# Patient Record
Sex: Female | Born: 1995
Health system: Southern US, Community
[De-identification: ages and names within clinical notes are randomized; demographics above are authoritative.]

## PROBLEM LIST (undated history)

## (undated) DIAGNOSIS — T7840XA Allergy, unspecified, initial encounter: Secondary | ICD-10-CM

## (undated) DIAGNOSIS — N83209 Unspecified ovarian cyst, unspecified side: Secondary | ICD-10-CM

## (undated) HISTORY — DX: Unspecified ovarian cyst, unspecified side: N83.209

## (undated) HISTORY — PX: OTHER SURGICAL HISTORY: SHX169

## (undated) HISTORY — DX: Allergy, unspecified, initial encounter: T78.40XA

---
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
## 2015-09-27 ENCOUNTER — Ambulatory Visit: Payer: Self-pay | Admitting: Sports Medicine

## 2015-09-29 ENCOUNTER — Ambulatory Visit (INDEPENDENT_AMBULATORY_CARE_PROVIDER_SITE_OTHER): Payer: Self-pay | Admitting: Family Medicine

## 2015-09-29 ENCOUNTER — Encounter: Payer: Self-pay | Admitting: Family Medicine

## 2015-09-29 VITALS — BP 133/77 | HR 70 | Ht 65.0 in | Wt 115.0 lb

## 2015-09-29 DIAGNOSIS — R040 Epistaxis: Secondary | ICD-10-CM

## 2015-09-29 LAB — HEMATOCRIT: HEMATOCRIT: 38.1 % (ref 36.0–46.0)

## 2015-09-29 LAB — HEMOGLOBIN: HEMOGLOBIN: 13.1 g/dL (ref 12.0–15.0)

## 2015-09-29 MED ORDER — ALBUTEROL SULFATE HFA 108 (90 BASE) MCG/ACT IN AERS: 1.0000 | INHALATION_SPRAY | Freq: Four times a day (QID) | RESPIRATORY_TRACT | Status: DC | PRN

## 2015-09-29 MED ORDER — FLUTICASONE PROPIONATE 50 MCG/ACT NA SUSP: 1.0000 | Freq: Every day | NASAL | Status: DC

## 2015-09-29 NOTE — Assessment & Plan Note (Signed)
Pt w/ continual epistaxis that has been ongoing for several yrs.  Improved with simple gel/nasal spray and nasal saline. Concern for non visualized polyp, sphenopalantine/other vessel irritation and chronic bleeding.   - Referral to ENT for scope/tx - Start on nasonex 2 sprays each nostril qd to decrease inflammation, continue with gel/nasal spray/cortisone topical solution,  - Will check H&H/Ferritin due to chronic nature of this and her competitive swimming.  If borderline low ferritin or H&H, consider supplement with Fe 325 mg BID to TID.

## 2015-09-29 NOTE — Progress Notes (Signed)
Sydney CroftCarolyn A Blankenship is a 19 y.o. who presents today for prolonged chronic epistaxis of the L nare only.    Recurrent Epistaxis - Pt states this has been ongoing now for several years, since she was a child, and was seen originally in 2014 for same issue.   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  She has noticed a few episodes where she has had 6-7 clots expunged from the left nare, bleeding through towels that she has used to cease the bleed, and has become lightheaded at times after her nose bleed.  No recall of inciting events that bring on these nose bleeds and cannot recall any intervention that alleviates her Sxs.  She improved originally on nasal saline along with nasal gel topical when previously seen in 2014 and never saw the ENT doctor because she improved. However about 3-4 months ago she started having increases in her epistaxis and thought this was worse than previously. She has continued with the gel and nasal saline with minimal relief at this time.  There is no recent or previous injury to either nare, previous history of seasonal allergies, worsening of nose bleeds or sneezing with change of seasons.    Pt denies any menorrhagia, prolonged bleeding after superficial wound, bleeding s/p dental procedures, or any family history of coagulation disorder.  As well, denies lightheaded/dizziness w/ activity, palpitations or shortness of breath, Sx including Pica/pagophagia.    Past Medical History  Diagnosis Date  . Ovarian cyst    History   Social History Main Topics  . Smoking status: Never Smoker   . Drug Use: No  . Sexual Activity: No   FMHx - negative for Von Wilbrand disease, other coagulation disease.  + for brother with EIA   Physical Exam Filed Vitals:   09/29/15 1612  BP: 133/77  Pulse: 70    Gen: NAD, Well nourished, Well developed Head/Eyes: PERRLA, EOMI,  [place]/AT Nose: + edematous turbinates B/L, no evidence of active vessel lesion, no polyp visualized Throat: no postnasal drip or posterior pharynx irritation Non tender maxillary/front sinus  Ears: TMI B/L, no evidence of effusion  Neck: Supple, no LAD Cardio: RRR, No murmurs Lungs: CTA, no wheezes, rhonchi, crackles MSK: ROM normal Neuro: CN 2-12 intact, +2 patellar and achilles relfex b/l  Psych: AAO x 3

## 2015-09-30 ENCOUNTER — Telehealth: Payer: Self-pay | Admitting: Family Medicine

## 2015-09-30 LAB — FERRITIN: FERRITIN: 12 ng/mL (ref 10–291)

## 2015-09-30 MED ORDER — FERROUS SULFATE 325 (65 FE) MG PO TBEC
325.0000 mg | DELAYED_RELEASE_TABLET | Freq: Three times a day (TID) | ORAL | Status: DC
Start: 2015-09-30 — End: 2019-07-28

## 2015-09-30 NOTE — Telephone Encounter (Signed)
Ferritin extremely low.  Supplement with iron 325 BID-TID with Vit C concurrent tx.  F/U in 4-6 weeks for repeat Ferritin.  Will f/u after ENT eval too.  Thanks, Twana FirstBryan R. Paulina FusiHess, [facility] 09/30/2015, 9:00 AM

## 2016-08-02 ENCOUNTER — Ambulatory Visit: Payer: No Typology Code available for payment source | Admitting: Sports Medicine

## 2018-01-14 ENCOUNTER — Ambulatory Visit: Payer: No Typology Code available for payment source | Admitting: Sports Medicine

## 2018-05-24 LAB — HM PAP SMEAR

## 2019-07-11 DIAGNOSIS — H52221 Regular astigmatism, right eye: Secondary | ICD-10-CM | POA: Diagnosis not present

## 2019-07-11 DIAGNOSIS — H5213 Myopia, bilateral: Secondary | ICD-10-CM | POA: Diagnosis not present

## 2019-07-28 ENCOUNTER — Ambulatory Visit (INDEPENDENT_AMBULATORY_CARE_PROVIDER_SITE_OTHER): Payer: Commercial Managed Care - PPO | Admitting: Family Medicine

## 2019-07-28 ENCOUNTER — Encounter: Payer: Self-pay | Admitting: Family Medicine

## 2019-07-28 DIAGNOSIS — E041 Nontoxic single thyroid nodule: Secondary | ICD-10-CM | POA: Diagnosis not present

## 2019-07-28 NOTE — Progress Notes (Signed)
   Office Visit Note   Patient: Sydney Blankenship           Date of Birth: [birth year]           MRN: [id number] Visit Date: 07/28/2019 Requested by: No referring provider defined for this encounter. PCP: Patient, No Pcp Per  Subjective: Chief Complaint  Patient presents with  . anterior neck nodule - Mom noticed 1 week ago    HPI: She is here with a nodule on her neck.  About a week ago her mother noticed a nodule while she was swallowing food.  Her mother has a history of thyroid cancer after getting a lot of neck x-rays when she was younger.  Sydney Blankenship is asymptomatic, good energy with no change in weight, no change in bowel or bladder function.  She otherwise feels well.               ROS: No fever or chills.  All other systems were reviewed and are negative.  Objective: Vital Signs: There were no vitals taken for this visit.  Physical Exam:  General:  Alert and oriented, in no acute distress. Pulm:  Breathing unlabored. Psy:  Normal mood, congruent affect. Skin: No rash on the skin. Neck: There is a palpable nodule to the right of midline in the thyroid.  It is nontender to palpation.   Imaging: Limited ultrasound of neck: The nodule appears to be solid and measures about 1.6 cm diameter.  I did not bill for the procedure or record the images.  Assessment & Plan: 1.  Right-sided thyroid nodule with family history of thyroid cancer in her mother. -We will schedule a full thyroid ultrasound with biopsy if indicated.  Labs drawn today as well.      Procedures: No procedures performed  No notes on file     PMFS History: Patient Active Problem List   Diagnosis Date Noted  . Recurrent epistaxis 08/07/2013   Past Medical History:  Diagnosis Date  . Ovarian cyst     History reviewed. No pertinent family history.  Past Surgical History:  Procedure Laterality Date  . OTHER SURGICAL HISTORY     No surgical history    Social History   Occupational History  .  Not on file  Tobacco Use  . Smoking status: Never Smoker  Substance and Sexual Activity  . Alcohol use: No    Alcohol/week: 0.0 standard drinks  . Drug use: No  . Sexual activity: Never

## 2019-07-29 ENCOUNTER — Telehealth: Payer: Self-pay | Admitting: Family Medicine

## 2019-07-29 LAB — CBC WITH DIFFERENTIAL/PLATELET
Absolute Monocytes: 583 cells/uL (ref 200–950)
Basophils Absolute: 40 cells/uL (ref 0–200)
Basophils Relative: 0.6 %
Eosinophils Absolute: 208 cells/uL (ref 15–500)
Eosinophils Relative: 3.1 %
HCT: 42.3 % (ref 35.0–45.0)
Hemoglobin: 14.3 g/dL (ref 11.7–15.5)
Lymphs Abs: 1353 cells/uL (ref 850–3900)
MCH: 30.6 pg (ref 27.0–33.0)
MCHC: 33.8 g/dL (ref 32.0–36.0)
MCV: 90.6 fL (ref 80.0–100.0)
MPV: 10 fL (ref 7.5–12.5)
Monocytes Relative: 8.7 %
Neutro Abs: 4516 cells/uL (ref 1500–7800)
Neutrophils Relative %: 67.4 %
Platelets: 197 10*3/uL (ref 140–400)
RBC: 4.67 10*6/uL (ref 3.80–5.10)
RDW: 12 % (ref 11.0–15.0)
Total Lymphocyte: 20.2 %
WBC: 6.7 10*3/uL (ref 3.8–10.8)

## 2019-07-29 LAB — THYROID PANEL WITH TSH
Free Thyroxine Index: 2 (ref 1.4–3.8)
T3 Uptake: 28 % (ref 22–35)
T4, Total: 7.2 ug/dL (ref 5.1–11.9)
TSH: 3.87 mIU/L

## 2019-07-29 LAB — SEDIMENTATION RATE: Sed Rate: 2 mm/h (ref 0–20)

## 2019-07-29 NOTE — Telephone Encounter (Signed)
Labs look good.  [place]

## 2019-08-04 ENCOUNTER — Telehealth: Payer: Self-pay | Admitting: Family Medicine

## 2019-08-04 ENCOUNTER — Ambulatory Visit
Admission: RE | Admit: 2019-08-04 | Discharge: 2019-08-04 | Disposition: A | Discharge status: Home / Self Care | Payer: 59 | Source: Ambulatory Visit | Attending: Family Medicine | Admitting: Family Medicine

## 2019-08-04 DIAGNOSIS — E041 Nontoxic single thyroid nodule: Secondary | ICD-10-CM

## 2019-08-04 NOTE — Telephone Encounter (Signed)
By radiology criteria, the nodule warrants 1 year follow-up ultrasound.

## 2019-08-05 NOTE — Addendum Note (Signed)
Addended by: Hortencia Pilar on: 08/05/2019 12:52 PM   Modules accepted: Orders

## 2019-08-05 NOTE — Telephone Encounter (Signed)
Per patient request, after discussing with her parents she would like referral to Dr. Harlow Asa to discuss possible biopsy of thyroid nodule.  Will request referral.

## 2019-11-15 DIAGNOSIS — Z20828 Contact with and (suspected) exposure to other viral communicable diseases: Secondary | ICD-10-CM | POA: Diagnosis not present

## 2019-12-24 DIAGNOSIS — L700 Acne vulgaris: Secondary | ICD-10-CM | POA: Diagnosis not present

## 2019-12-25 DIAGNOSIS — Z23 Encounter for immunization: Secondary | ICD-10-CM | POA: Diagnosis not present

## 2019-12-25 MED FILL — CLINDAMYCIN PH 1% GEL: 1 | 30 days supply | Qty: 60 | Fill #0

## 2019-12-25 MED FILL — MINOCYCLINE 100 MG CAPSULE: 100 | 30 days supply | Qty: 60 | Fill #0

## 2019-12-25 MED FILL — EPIDUO FORTE 0.3-2.5% GEL P: 0.3-2.5 | 30 days supply | Qty: 45 | Fill #0

## 2020-01-22 DIAGNOSIS — Z23 Encounter for immunization: Secondary | ICD-10-CM | POA: Diagnosis not present

## 2020-02-05 DIAGNOSIS — Z01419 Encounter for gynecological examination (general) (routine) without abnormal findings: Secondary | ICD-10-CM | POA: Diagnosis not present

## 2020-02-05 DIAGNOSIS — Z6821 Body mass index (BMI) 21.0-21.9, adult: Secondary | ICD-10-CM | POA: Diagnosis not present

## 2020-02-05 DIAGNOSIS — Z118 Encounter for screening for other infectious and parasitic diseases: Secondary | ICD-10-CM | POA: Diagnosis not present

## 2020-02-13 MED FILL — MINOCYCLINE 100 MG CAPSULE: 100 | 30 days supply | Qty: 60 | Fill #1

## 2020-03-03 IMAGING — US US THYROID
1 series · 13 of 25 positions shown · non-contrast
Comparison: None.

CLINICAL DATA: Right thyroid nodule on exam

EXAM:
THYROID ULTRASOUND
TECHNIQUE: Ultrasound examination of the thyroid gland and adjacent soft
tissues was performed.

[Series 1: us thyroid · 0.05mm/px · 13 of 50 slices shown]
[im 1/50]
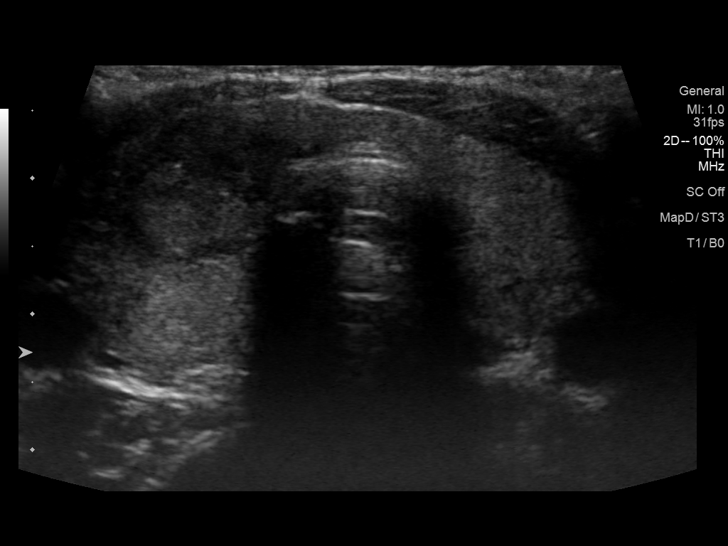
[im 5/50]
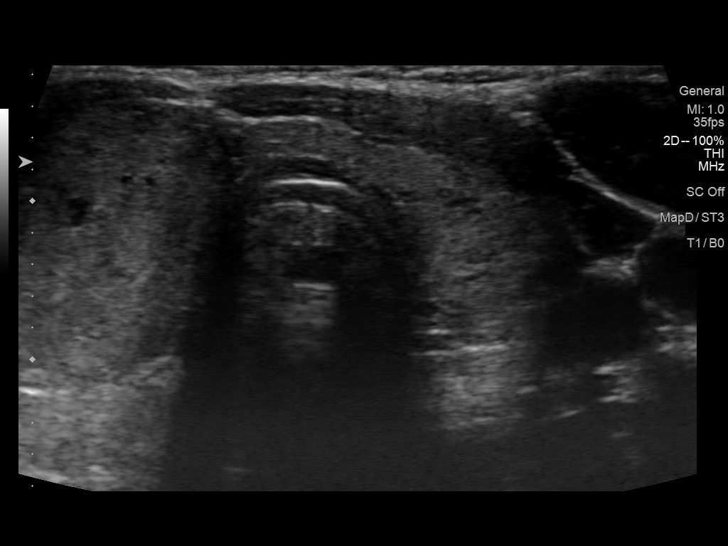
[im 9/50]
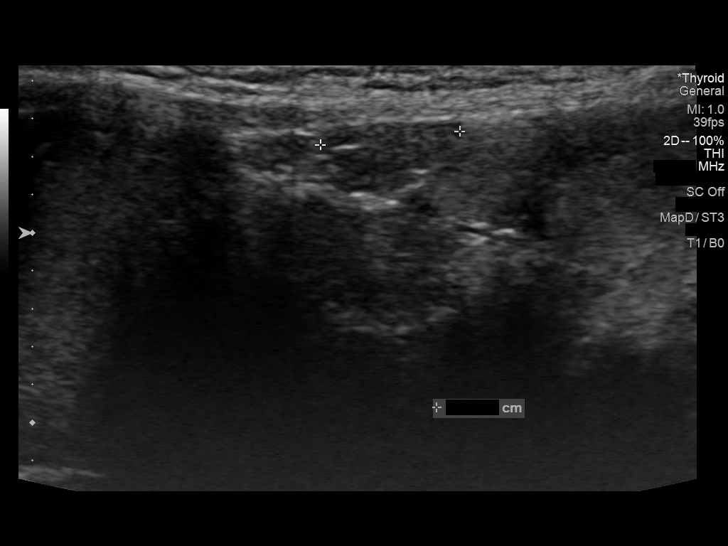
[im 13/50]
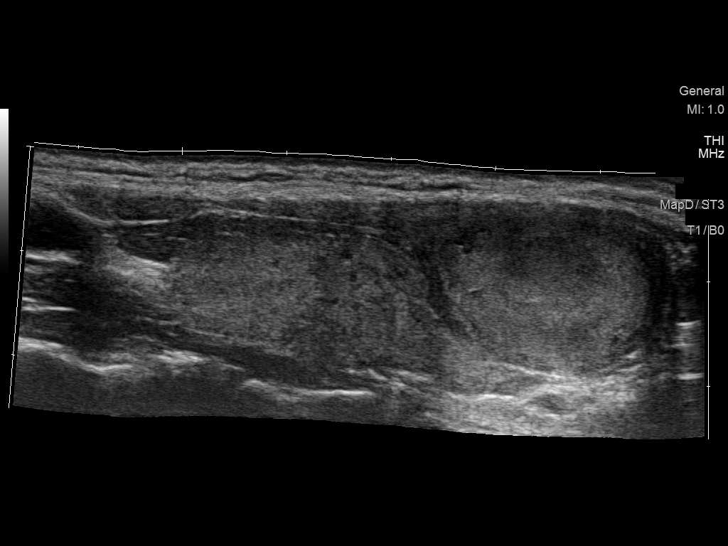
[im 17/50]
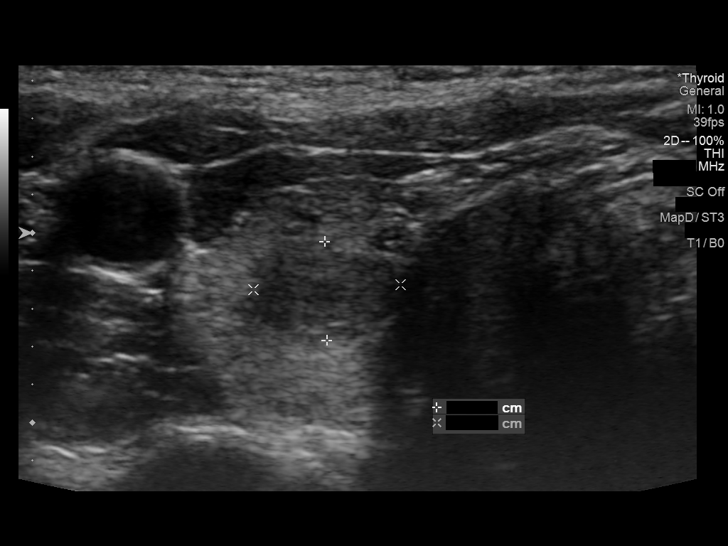
[im 21/50]
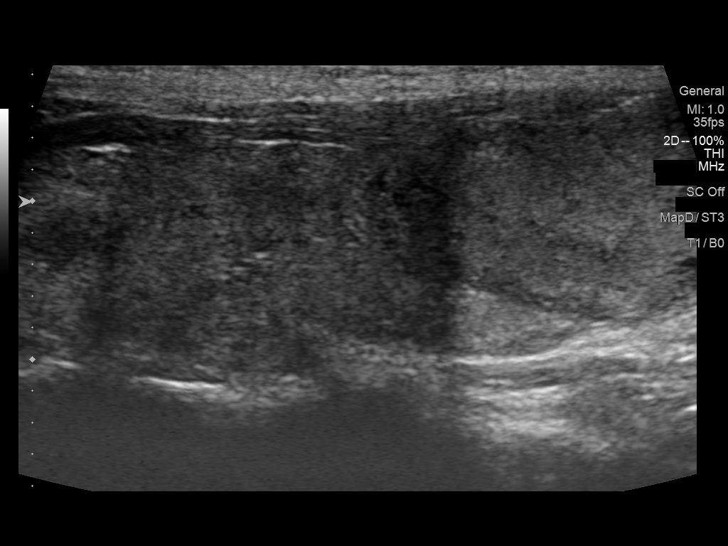
[im 25/50]
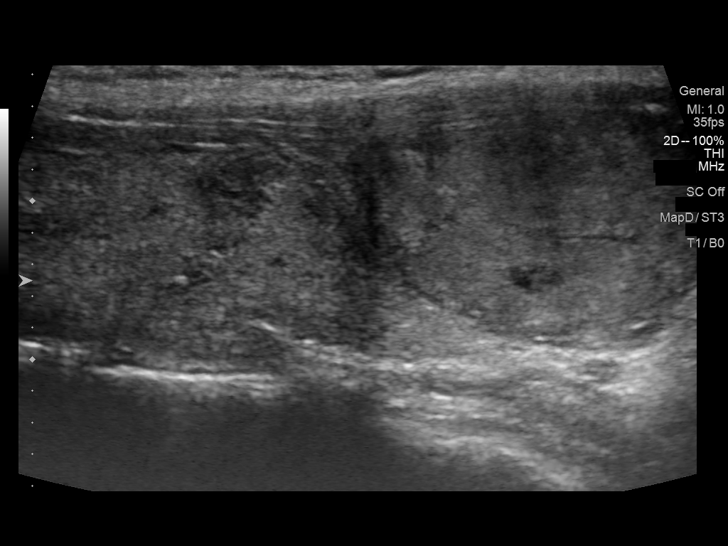
[im 29/50]
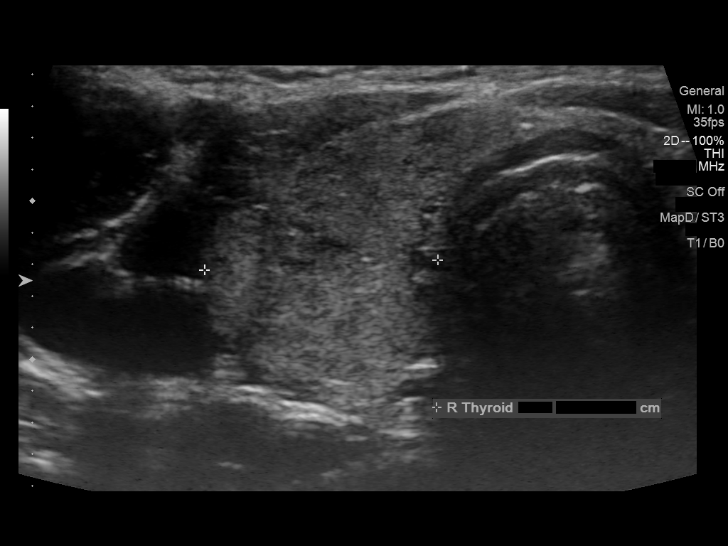
[im 33/50]
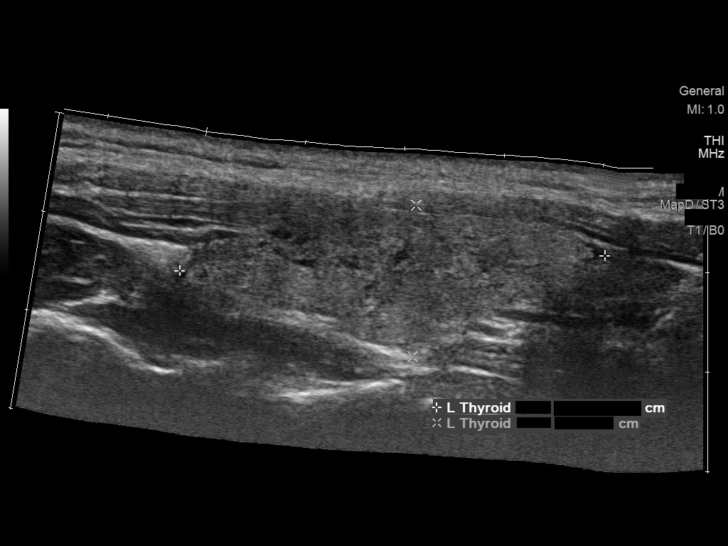
[im 37/50]
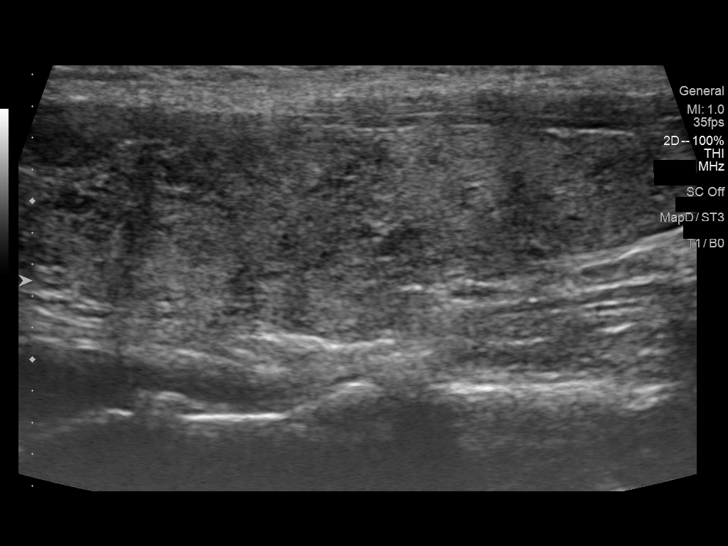
[im 41/50]
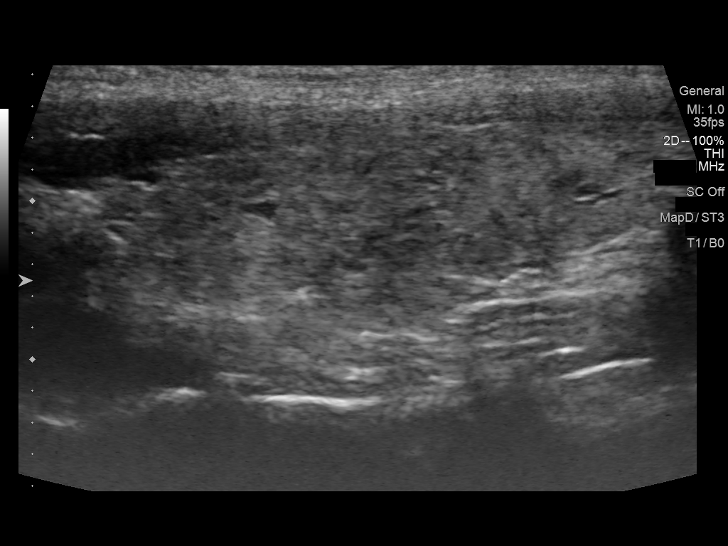
[im 45/50]
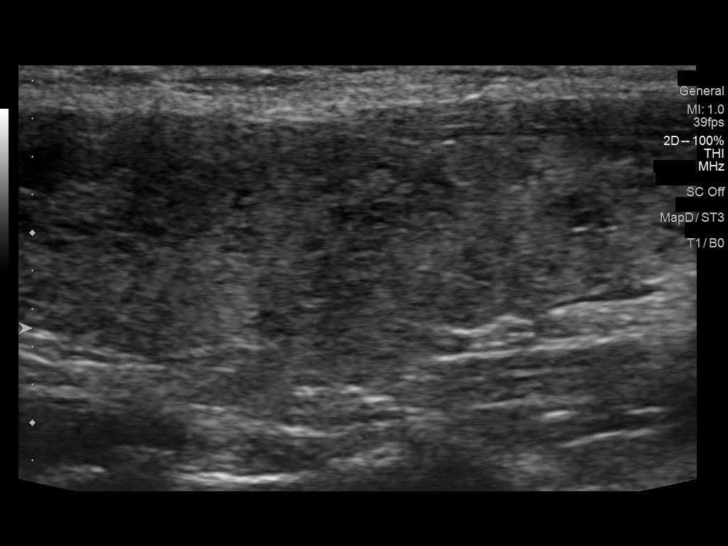
[im 50/50]
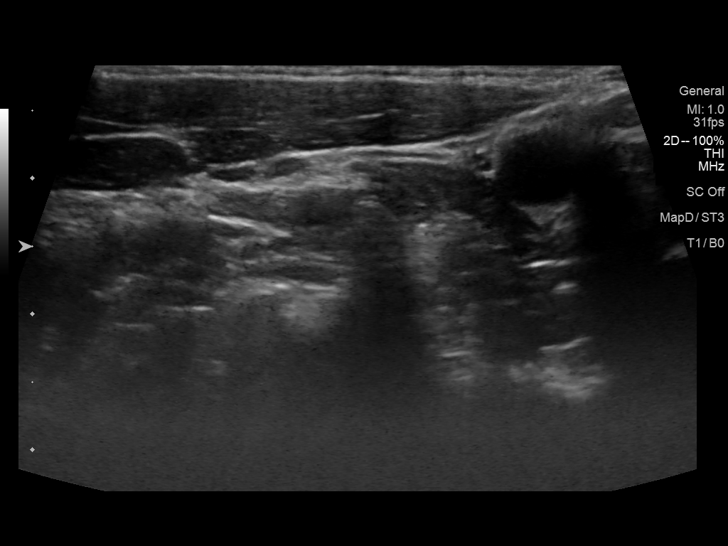

[13 of 25 positions shown; findings below may reference images not displayed]

FINDINGS: Parenchymal Echotexture: Mildly heterogenous

Isthmus: 3 mm

Right lobe: 5.1 x 1.7 x 1.5 cm

Left lobe: 4.3 x 1.5 x 1.2 cm

_________________________________________________________

Estimated total number of nodules >/= 1 cm: 1

Number of spongiform nodules >/=  2 cm not described below (TR1): 0

Number of mixed cystic and solid nodules >/= 1.5 cm not described
below (TR2): 0

_________________________________________________________

Nodule # 1:

Location: Right; Inferior

Maximum size: 2.3 cm; Other 2 dimensions: 1.9 x 1.6 cm

Composition: solid/almost completely solid (2)

Echogenicity: isoechoic (1)

Shape: not taller-than-wide (0)

Margins: smooth (0)

Echogenic foci: none (0)

ACR TI-RADS total points: 3.

ACR TI-RADS risk category: TR3 (3 points).

ACR TI-RADS recommendations:

*Given size (>/= 1.5 - 2.4 cm) and appearance, a follow-up
ultrasound in 1 year should be considered based on TI-RADS criteria.

_________________________________________________________

There are additional isthmus and right thyroid hypoechoic
subcentimeter nodules all measuring 8 mm or less in size. These
would not meet criteria for any biopsy or follow-up.

No significant left thyroid abnormality.

Normal vascularity.  No regional adenopathy.
IMPRESSION: 2.3 cm right inferior TR 3 nodule meets criteria for follow-up in 1
year as above.

The above is in keeping with the ACR TI-RADS recommendations - [HOSPITAL] 0754;[DATE].

## 2020-03-23 DIAGNOSIS — Z3202 Encounter for pregnancy test, result negative: Secondary | ICD-10-CM | POA: Diagnosis not present

## 2020-03-23 DIAGNOSIS — R030 Elevated blood-pressure reading, without diagnosis of hypertension: Secondary | ICD-10-CM | POA: Diagnosis not present

## 2020-03-23 DIAGNOSIS — Z309 Encounter for contraceptive management, unspecified: Secondary | ICD-10-CM | POA: Diagnosis not present

## 2020-03-23 DIAGNOSIS — N946 Dysmenorrhea, unspecified: Secondary | ICD-10-CM | POA: Diagnosis not present

## 2020-03-29 DIAGNOSIS — D509 Iron deficiency anemia, unspecified: Secondary | ICD-10-CM | POA: Diagnosis not present

## 2020-03-29 DIAGNOSIS — N939 Abnormal uterine and vaginal bleeding, unspecified: Secondary | ICD-10-CM | POA: Diagnosis not present

## 2020-03-29 DIAGNOSIS — Z3202 Encounter for pregnancy test, result negative: Secondary | ICD-10-CM | POA: Diagnosis not present

## 2020-03-29 DIAGNOSIS — R102 Pelvic and perineal pain: Secondary | ICD-10-CM | POA: Diagnosis not present

## 2020-04-28 MED FILL — MINOCYCLINE 100 MG CAPSULE: 100 | 30 days supply | Qty: 60 | Fill #2

## 2020-07-12 DIAGNOSIS — H52221 Regular astigmatism, right eye: Secondary | ICD-10-CM | POA: Diagnosis not present

## 2020-07-12 DIAGNOSIS — H53143 Visual discomfort, bilateral: Secondary | ICD-10-CM | POA: Diagnosis not present

## 2020-07-12 DIAGNOSIS — H53123 Transient visual loss, bilateral: Secondary | ICD-10-CM | POA: Diagnosis not present

## 2020-07-12 DIAGNOSIS — H5213 Myopia, bilateral: Secondary | ICD-10-CM | POA: Diagnosis not present

## 2020-08-17 MED FILL — MINOCYCLINE 100 MG CAPSULE: 100 | 30 days supply | Qty: 60 | Fill #3

## 2020-09-22 ENCOUNTER — Ambulatory Visit: Payer: 59 | Admitting: Physician Assistant

## 2020-10-18 ENCOUNTER — Ambulatory Visit (INDEPENDENT_AMBULATORY_CARE_PROVIDER_SITE_OTHER): Payer: No Typology Code available for payment source | Admitting: Physician Assistant

## 2020-10-18 ENCOUNTER — Other Ambulatory Visit: Payer: Self-pay

## 2020-10-18 ENCOUNTER — Other Ambulatory Visit (HOSPITAL_COMMUNITY): Payer: Self-pay | Admitting: Physician Assistant

## 2020-10-18 ENCOUNTER — Encounter: Payer: Self-pay | Admitting: Physician Assistant

## 2020-10-18 ENCOUNTER — Other Ambulatory Visit: Payer: Self-pay | Admitting: Physician Assistant

## 2020-10-18 VITALS — BP 140/100 | HR 62 | Temp 98.4°F | Ht 65.0 in | Wt 133.0 lb

## 2020-10-18 DIAGNOSIS — R5383 Other fatigue: Secondary | ICD-10-CM | POA: Diagnosis not present

## 2020-10-18 DIAGNOSIS — Z1159 Encounter for screening for other viral diseases: Secondary | ICD-10-CM | POA: Diagnosis not present

## 2020-10-18 DIAGNOSIS — Z114 Encounter for screening for human immunodeficiency virus [HIV]: Secondary | ICD-10-CM | POA: Diagnosis not present

## 2020-10-18 DIAGNOSIS — D509 Iron deficiency anemia, unspecified: Secondary | ICD-10-CM | POA: Diagnosis not present

## 2020-10-18 DIAGNOSIS — E041 Nontoxic single thyroid nodule: Secondary | ICD-10-CM | POA: Diagnosis not present

## 2020-10-18 DIAGNOSIS — Z136 Encounter for screening for cardiovascular disorders: Secondary | ICD-10-CM | POA: Diagnosis not present

## 2020-10-18 DIAGNOSIS — Z0000 Encounter for general adult medical examination without abnormal findings: Secondary | ICD-10-CM

## 2020-10-18 DIAGNOSIS — Z13220 Encounter for screening for lipoid disorders: Secondary | ICD-10-CM

## 2020-10-18 MED ORDER — SUMATRIPTAN SUCCINATE 100 MG PO TABS: 100.0000 mg | ORAL_TABLET | ORAL | 0 refills | Status: DC | PRN

## 2020-10-18 MED FILL — SUMAtriptan SUCCINATE 100 M: 100 | 30 days supply | Qty: 9 | Fill #0

## 2020-10-18 NOTE — Patient Instructions (Signed)
It was great to see you!  Please go to the lab for blood work.   Someone will be in touch with your scheduling your thyroid ultrasound.  Our office will call you with your results unless you have chosen to receive results via MyChart.  If your blood work is normal we will follow-up each year for physicals and as scheduled for chronic medical problems.  If anything is abnormal we will treat accordingly and get you in for a follow-up.  Take care,  [facility] Maintenance, Female Adopting a healthy lifestyle and getting preventive care are important in promoting health and wellness. Ask your health care provider about:  The right schedule for you to have regular tests and exams.  Things you can do on your own to prevent diseases and keep yourself healthy. What should I know about diet, weight, and exercise? Eat a healthy diet   Eat a diet that includes plenty of vegetables, fruits, low-fat dairy products, and lean protein.  Do not eat a lot of foods that are high in solid fats, added sugars, or sodium. Maintain a healthy weight Body mass index (BMI) is used to identify weight problems. It estimates body fat based on height and weight. Your health care provider can help determine your BMI and help you achieve or maintain a healthy weight. Get regular exercise Get regular exercise. This is one of the most important things you can do for your health. Most adults should:  Exercise for at least 150 minutes each week. The exercise should increase your heart rate and make you sweat (moderate-intensity exercise).  Do strengthening exercises at least twice a week. This is in addition to the moderate-intensity exercise.  Spend less time sitting. Even light physical activity can be beneficial. Watch cholesterol and blood lipids Have your blood tested for lipids and cholesterol at 24 years of age, then have this test every 5 years. Have your cholesterol levels checked more often  if:  Your lipid or cholesterol levels are high.  You are older than 24 years of age.  You are at high risk for heart disease. What should I know about cancer screening? Depending on your health history and family history, you may need to have cancer screening at various ages. This may include screening for:  Breast cancer.  Cervical cancer.  Colorectal cancer.  Skin cancer.  Lung cancer. What should I know about heart disease, diabetes, and high blood pressure? Blood pressure and heart disease  High blood pressure causes heart disease and increases the risk of stroke. This is more likely to develop in people who have high blood pressure readings, are of African descent, or are overweight.  Have your blood pressure checked: ? Every 3-5 years if you are 8-23 years of age. ? Every year if you are 33 years old or older. Diabetes Have regular diabetes screenings. This checks your fasting blood sugar level. Have the screening done:  Once every three years after age 53 if you are at a normal weight and have a low risk for diabetes.  More often and at a younger age if you are overweight or have a high risk for diabetes. What should I know about preventing infection? Hepatitis B If you have a higher risk for hepatitis B, you should be screened for this virus. Talk with your health care provider to find out if you are at risk for hepatitis B infection. Hepatitis C Testing is recommended for:  Everyone born from [birth year]  through 1965.  Anyone with known risk factors for hepatitis C. Sexually transmitted infections (STIs)  Get screened for STIs, including gonorrhea and chlamydia, if: ? You are sexually active and are younger than 24 years of age. ? You are older than 24 years of age and your health care provider tells you that you are at risk for this type of infection. ? Your sexual activity has changed since you were last screened, and you are at increased risk for chlamydia or  gonorrhea. Ask your health care provider if you are at risk.  Ask your health care provider about whether you are at high risk for HIV. Your health care provider may recommend a prescription medicine to help prevent HIV infection. If you choose to take medicine to prevent HIV, you should first get tested for HIV. You should then be tested every 3 months for as long as you are taking the medicine. Pregnancy  If you are about to stop having your period (premenopausal) and you may become pregnant, seek counseling before you get pregnant.  Take 400 to 800 micrograms (mcg) of folic acid every day if you become pregnant.  Ask for birth control (contraception) if you want to prevent pregnancy. Osteoporosis and menopause Osteoporosis is a disease in which the bones lose minerals and strength with aging. This can result in bone fractures. If you are 61 years old or older, or if you are at risk for osteoporosis and fractures, ask your health care provider if you should:  Be screened for bone loss.  Take a calcium or vitamin D supplement to lower your risk of fractures.  Be given hormone replacement therapy (HRT) to treat symptoms of menopause. Follow these instructions at home: Lifestyle  Do not use any products that contain nicotine or tobacco, such as cigarettes, e-cigarettes, and chewing tobacco. If you need help quitting, ask your health care provider.  Do not use street drugs.  Do not share needles.  Ask your health care provider for help if you need support or information about quitting drugs. Alcohol use  Do not drink alcohol if: ? Your health care provider tells you not to drink. ? You are pregnant, may be pregnant, or are planning to become pregnant.  If you drink alcohol: ? Limit how much you use to 0-1 drink a day. ? Limit intake if you are breastfeeding.  Be aware of how much alcohol is in your drink. In the [place] one drink equals one 12 oz bottle of beer (355 mL), one 5 oz  glass of wine (148 mL), or one 1 oz glass of hard liquor (44 mL). General instructions  Schedule regular health, dental, and eye exams.  Stay current with your vaccines.  Tell your health care provider if: ? You often feel depressed. ? You have ever been abused or do not feel safe at home. Summary  Adopting a healthy lifestyle and getting preventive care are important in promoting health and wellness.  Follow your health care provider's instructions about healthy diet, exercising, and getting tested or screened for diseases.  Follow your health care provider's instructions on monitoring your cholesterol and blood pressure. This information is not intended to replace advice given to you by your health care provider. Make sure you discuss any questions you have with your health care provider. Document Revised: 11/20/2018 Document Reviewed: 11/20/2018 Elsevier Patient Education  2020 [employer].

## 2020-10-18 NOTE — Progress Notes (Signed)
Sydney Blankenship is a 24 y.o. female here to Establish care.  I acted as a Neurosurgeon for [employer], PA-C Corky Mull, LPN   History of Present Illness:   Chief Complaint  Patient presents with  . Establish Care  . Annual Exam    Acute Concerns: Fatigue, weakness -- was told in the past that she had low iron in 2016, was told to take oral iron. Goes to bed around 1030p - 700a. Denies snoring. Weakness feels like "her head is heavy." Gets lightheaded at times when going from sitting to standing. Denies: unusual chest pain, SOB when exercising. Denies extremity weakness or lethargy. She is under significant stress -- planning to start a new job tomorrow and is living on her own for the first time.  Chronic Issues: Iron deficiency anemia -- was found in the past during labwork for fatigue. Periods lasts 5-7 days. Heavy for 2-3 days where she has to change pad/tampon -- q 3 hours. Took OCPs for about 1 month but she felt like the moodiness was not worth taking the rx. Thyroid nodule -- found last year in 2020. She had normal labs but did have thyroid u/s confirming nodule and recommending repeat imaging in 1 year. Has a 2.3 cm right inferior TR 3 nodule. Migraines -- gets migraines with certain alcohol, lack of sleep or stressful situation. Her mom has an abortive medication that the patient takes which stops her migraines almost immediately. She is unsure of the name of this. Migraines are overall rare.  Health Maintenance: Immunizations -- UTD  Colonoscopy -- N/A Mammogram -- N/A PAP -- UTD, done 2020 will get records Bone Density -- N/A Diet -- eats regular meals, very healthy, drinks water; no regular sodas or juices Caffeine intake -- 2 coffees daily Sleep habits -- no major concerns Exercise -- weight lifting and cardio mix -- 5 days a week Weight -- Weight: 133 lb (60.3 kg)  Mood -- no concerns right now Alcohol -- 2 drinks per week Tobacco -- none  Depression screen  [facility] 2/9 10/18/2020  Decreased Interest 0  Down, Depressed, Hopeless 0  PHQ - 2 Score 0    No flowsheet data found.   Other providers/specialists: Patient Care Team: Jarold Motto, [place] as PCP - General (Physician Assistant) Darnell Level, MD as Consulting Physician (General Surgery)   Past Medical History:  Diagnosis Date  . Allergy   . Ovarian cyst    8th grade     Social History   Tobacco Use  . Smoking status: Never Smoker  . Smokeless tobacco: Never Used  Vaping Use  . Vaping Use: Never used  Substance Use Topics  . Alcohol use: Yes    Alcohol/week: 2.0 standard drinks    Types: 1 Glasses of wine, 1 Cans of beer per week  . Drug use: Never    Past Surgical History:  Procedure Laterality Date  . OTHER SURGICAL HISTORY     No surgical history     Family History  Problem Relation Age of Onset  . Osteoarthritis Mother   . Thyroid cancer Mother   . Crohn's disease Sister   . Lung cancer Maternal Grandmother   . Kidney disease Maternal Grandfather   . Heart attack Paternal Grandfather   . Heart disease Paternal Grandfather     No Known Allergies   Current Medications:   Current Outpatient Medications:  .  ibuprofen (ADVIL,MOTRIN) 200 MG tablet, Take 400 mg by mouth every 6 (six) hours as  needed. , Disp: , Rfl:    Review of Systems:   Review of Systems  Constitutional: Negative.  Negative for chills, fever, malaise/fatigue and weight loss.  HENT: Negative.  Negative for hearing loss, sinus pain and sore throat.   Eyes: Negative.  Negative for blurred vision.  Respiratory: Negative.  Negative for cough and shortness of breath.   Cardiovascular: Negative.  Negative for chest pain, palpitations and leg swelling.  Gastrointestinal: Negative for constipation, diarrhea, heartburn, nausea and vomiting.  Genitourinary: Negative.  Negative for dysuria, frequency and urgency.  Musculoskeletal: Negative.  Negative for back pain, myalgias and neck pain.  Skin:  Negative.  Negative for itching and rash.  Neurological: Negative for dizziness, tingling, seizures, loss of consciousness and headaches.  Endo/Heme/Allergies: Negative.  Negative for polydipsia.  Psychiatric/Behavioral: Negative.  Negative for depression. The patient is not nervous/anxious.     Vitals:   Vitals:   10/18/20 1310  BP: (!) 140/100  Pulse: 62  Temp: 98.4 F (36.9 C)  TempSrc: Temporal  SpO2: 99%  Weight: 133 lb (60.3 kg)  Height: 5\' 5"  (1.651 m)      Body mass index is 22.13 kg/m.  Physical Exam:   Physical Exam Vitals and nursing note reviewed.  Constitutional:      General: She is not in acute distress.    Appearance: Normal appearance. She is well-developed. She is not ill-appearing or toxic-appearing.  HENT:     Head: Normocephalic and atraumatic.     Right Ear: Tympanic membrane, ear canal and external ear normal. Tympanic membrane is not erythematous, retracted or bulging.     Left Ear: Tympanic membrane, ear canal and external ear normal. Tympanic membrane is not erythematous, retracted or bulging.  Eyes:     General: Lids are normal.     Conjunctiva/sclera: Conjunctivae normal.     Pupils: Pupils are equal, round, and reactive to light.  Neck:     Trachea: Trachea normal.  Cardiovascular:     Rate and Rhythm: Normal rate and regular rhythm.     Heart sounds: Normal heart sounds, S1 normal and S2 normal.  Pulmonary:     Effort: Pulmonary effort is normal. No tachypnea or respiratory distress.     Breath sounds: Normal breath sounds. No decreased breath sounds, wheezing, rhonchi or rales.  Abdominal:     General: Bowel sounds are normal.     Palpations: Abdomen is soft.     Tenderness: There is no abdominal tenderness.  Musculoskeletal:        General: Normal range of motion.     Cervical back: Full passive range of motion without pain.  Lymphadenopathy:     Cervical: No cervical adenopathy.  Skin:    General: Skin is warm and dry.   Neurological:     Mental Status: She is alert.     GCS: GCS eye subscore is 4. GCS verbal subscore is 5. GCS motor subscore is 6.     Cranial Nerves: No cranial nerve deficit.     Sensory: No sensory deficit.     Deep Tendon Reflexes: Reflexes are normal and symmetric.  Psychiatric:        Speech: Speech normal.        Behavior: Behavior normal. Behavior is cooperative.     Assessment and Plan:   Kyler was seen today for establish care and annual exam.  Diagnoses and all orders for this visit:  Routine physical examination Today patient counseled on age appropriate routine health concerns  for screening and prevention, each reviewed and up to date or declined. Immunizations reviewed and up to date or declined. Labs ordered and reviewed. Risk factors for depression reviewed and negative. Hearing function and visual acuity are intact. ADLs screened and addressed as needed. Functional ability and level of safety reviewed and appropriate. Education, counseling and referrals performed based on assessed risks today. Patient provided with a copy of personalized plan for preventive services.  Fatigue, unspecified type No red flags on exam. Suspect multifactorial -- anxiety, possible anemia, among other causes. Will obtain labs including Vitamin B12 and D for further evaluation. Recommend follow-up in our office after she gets situated at her job if her symptoms worsen/persist. -     CBC with Differential/Platelet; Future -     Comprehensive metabolic panel; Future -     Vitamin B12; Future -     VITAMIN D 25 Hydroxy (Vit-D Deficiency, Fractures); Future -     VITAMIN D 25 Hydroxy (Vit-D Deficiency, Fractures) -     Vitamin B12 -     Comprehensive metabolic panel -     CBC with Differential/Platelet  Iron deficiency anemia, unspecified iron deficiency anemia type No red flags on exam. Will update labs today and advise on supplementation as indicated. -     Ferritin; Future -      Ferritin  Right thyroid nodule Update thyroid labs today and obtain follow-up u/s. -     US THYROID; Future -     TSH; Future -     T4, free; Future  Screening for HIV (human immunodeficiency virus) -     HIV Antibody (routine testing w rflx); Future -     HIV Antibody (routine testing w rflx)  Encounter for screening for other viral diseases -     Hepatitis C antibody; Future -     Hepatitis C antibody  Encounter for lipid screening for cardiovascular disease -     Lipid panel; Future -     Lipid panel  CMA or LPN served as scribe during this visit. History, Physical, and Plan performed by medical provider. The above documentation has been reviewed and is accurate and complete.  Jarold Motto, PA-C

## 2020-10-19 ENCOUNTER — Other Ambulatory Visit: Payer: Self-pay | Admitting: Physician Assistant

## 2020-10-19 DIAGNOSIS — R17 Unspecified jaundice: Secondary | ICD-10-CM

## 2020-10-19 LAB — FERRITIN: Ferritin: 23 ng/mL (ref 16–154)

## 2020-10-19 LAB — CBC WITH DIFFERENTIAL/PLATELET
Absolute Monocytes: 760 cells/uL (ref 200–950)
Basophils Absolute: 80 cells/uL (ref 0–200)
Basophils Relative: 0.8 %
Eosinophils Absolute: 370 cells/uL (ref 15–500)
Eosinophils Relative: 3.7 %
HCT: 45.4 % — ABNORMAL HIGH (ref 35.0–45.0)
Hemoglobin: 15.5 g/dL (ref 11.7–15.5)
Lymphs Abs: 1400 cells/uL (ref 850–3900)
MCH: 30.1 pg (ref 27.0–33.0)
MCHC: 34.1 g/dL (ref 32.0–36.0)
MCV: 88.2 fL (ref 80.0–100.0)
MPV: 9.8 fL (ref 7.5–12.5)
Monocytes Relative: 7.6 %
Neutro Abs: 7390 cells/uL (ref 1500–7800)
Neutrophils Relative %: 73.9 %
Platelets: 225 10*3/uL (ref 140–400)
RBC: 5.15 10*6/uL — ABNORMAL HIGH (ref 3.80–5.10)
RDW: 11.7 % (ref 11.0–15.0)
Total Lymphocyte: 14 %
WBC: 10 10*3/uL (ref 3.8–10.8)

## 2020-10-19 LAB — LIPID PANEL
Cholesterol: 150 mg/dL (ref <200)
HDL: 46 mg/dL — ABNORMAL LOW (ref >=50)
LDL Cholesterol (Calc): 76 mg/dL (calc)
Non-HDL Cholesterol (Calc): 104 mg/dL (calc) (ref <130)
Total CHOL/HDL Ratio: 3.3 (calc) (ref <5.0)
Triglycerides: 180 mg/dL — ABNORMAL HIGH (ref <150)

## 2020-10-19 LAB — COMPREHENSIVE METABOLIC PANEL
AG Ratio: 1.7 (calc) (ref 1.0–2.5)
ALT: 15 U/L (ref 6–29)
AST: 18 U/L (ref 10–30)
Albumin: 4.7 g/dL (ref 3.6–5.1)
Alkaline phosphatase (APISO): 47 U/L (ref 31–125)
BUN: 14 mg/dL (ref 7–25)
CO2: 26 mmol/L (ref 20–32)
Calcium: 9.7 mg/dL (ref 8.6–10.2)
Chloride: 102 mmol/L (ref 98–110)
Creat: 0.91 mg/dL (ref 0.50–1.10)
Globulin: 2.8 g/dL (calc) (ref 1.9–3.7)
Glucose, Bld: 79 mg/dL (ref 65–99)
Potassium: 4.1 mmol/L (ref 3.5–5.3)
Sodium: 137 mmol/L (ref 135–146)
Total Bilirubin: 1.9 mg/dL — ABNORMAL HIGH (ref 0.2–1.2)
Total Protein: 7.5 g/dL (ref 6.1–8.1)

## 2020-10-19 LAB — HEPATITIS C ANTIBODY
Hepatitis C Ab: NONREACTIVE
SIGNAL TO CUT-OFF: 0.02 (ref <1.00)

## 2020-10-19 LAB — VITAMIN B12: Vitamin B-12: 605 pg/mL (ref 200–1100)

## 2020-10-19 LAB — T4, FREE: Free T4: 1 ng/dL (ref 0.8–1.8)

## 2020-10-19 LAB — HIV ANTIBODY (ROUTINE TESTING W REFLEX): HIV 1&2 Ab, 4th Generation: NONREACTIVE

## 2020-10-19 LAB — TSH: TSH: 3.68 mIU/L

## 2020-10-19 LAB — VITAMIN D 25 HYDROXY (VIT D DEFICIENCY, FRACTURES): Vit D, 25-Hydroxy: 40 ng/mL (ref 30–100)

## 2020-10-26 ENCOUNTER — Encounter: Payer: Self-pay | Admitting: Physician Assistant

## 2020-10-26 LAB — CHLAMYDIA TRACHOMATIS, PROBE AMP
Chlamydia trachomatis, NAA: NEGATIVE
Neisseria gonorrhoeae, NAA: NEGATIVE

## 2020-10-28 ENCOUNTER — Other Ambulatory Visit: Payer: No Typology Code available for payment source

## 2020-10-28 ENCOUNTER — Other Ambulatory Visit: Payer: Self-pay

## 2020-10-28 DIAGNOSIS — R17 Unspecified jaundice: Secondary | ICD-10-CM

## 2020-10-29 ENCOUNTER — Encounter: Payer: Self-pay | Admitting: Physician Assistant

## 2020-10-29 LAB — HEPATIC FUNCTION PANEL
AG Ratio: 1.7 (calc) (ref 1.0–2.5)
ALT: 14 U/L (ref 6–29)
AST: 18 U/L (ref 10–30)
Albumin: 4.5 g/dL (ref 3.6–5.1)
Alkaline phosphatase (APISO): 38 U/L (ref 31–125)
Bilirubin, Direct: 0.4 mg/dL — ABNORMAL HIGH (ref 0.0–0.2)
Globulin: 2.7 g/dL (calc) (ref 1.9–3.7)
Indirect Bilirubin: 1.8 mg/dL (calc) — ABNORMAL HIGH (ref 0.2–1.2)
Total Bilirubin: 2.2 mg/dL — ABNORMAL HIGH (ref 0.2–1.2)
Total Protein: 7.2 g/dL (ref 6.1–8.1)

## 2020-10-29 LAB — HAPTOGLOBIN: Haptoglobin: 52 mg/dL (ref 43–212)

## 2020-11-12 ENCOUNTER — Encounter: Payer: Self-pay | Admitting: Physician Assistant

## 2020-11-15 ENCOUNTER — Other Ambulatory Visit: Payer: Self-pay | Admitting: Physician Assistant

## 2020-11-15 DIAGNOSIS — F419 Anxiety disorder, unspecified: Secondary | ICD-10-CM

## 2020-11-24 ENCOUNTER — Other Ambulatory Visit (HOSPITAL_COMMUNITY): Payer: Self-pay | Admitting: Physician Assistant

## 2020-11-24 ENCOUNTER — Other Ambulatory Visit: Payer: Self-pay | Admitting: *Deleted

## 2020-11-24 MED ORDER — SUMATRIPTAN SUCCINATE 100 MG PO TABS: 100.0000 mg | ORAL_TABLET | ORAL | 2 refills | Status: DC | PRN

## 2020-11-24 MED FILL — SUMAtriptan SUCCINATE 100 M: 100 | 30 days supply | Qty: 9 | Fill #0

## 2020-12-07 MED FILL — MINOCYCLINE 100 MG CAPSULE: 100 | 30 days supply | Qty: 60 | Fill #4

## 2020-12-13 ENCOUNTER — Ambulatory Visit (INDEPENDENT_AMBULATORY_CARE_PROVIDER_SITE_OTHER): Payer: No Typology Code available for payment source | Admitting: Psychology

## 2020-12-13 DIAGNOSIS — F411 Generalized anxiety disorder: Secondary | ICD-10-CM | POA: Diagnosis not present

## 2020-12-28 ENCOUNTER — Ambulatory Visit (INDEPENDENT_AMBULATORY_CARE_PROVIDER_SITE_OTHER): Payer: No Typology Code available for payment source | Admitting: Psychology

## 2020-12-28 DIAGNOSIS — F411 Generalized anxiety disorder: Secondary | ICD-10-CM

## 2020-12-29 MED FILL — SUMAtriptan SUCCINATE 100 M: 100 | 30 days supply | Qty: 9 | Fill #1

## 2021-01-10 ENCOUNTER — Ambulatory Visit (INDEPENDENT_AMBULATORY_CARE_PROVIDER_SITE_OTHER): Payer: No Typology Code available for payment source | Admitting: Psychology

## 2021-01-10 DIAGNOSIS — F411 Generalized anxiety disorder: Secondary | ICD-10-CM

## 2021-01-26 ENCOUNTER — Ambulatory Visit (INDEPENDENT_AMBULATORY_CARE_PROVIDER_SITE_OTHER): Payer: No Typology Code available for payment source | Admitting: Psychology

## 2021-01-26 DIAGNOSIS — F411 Generalized anxiety disorder: Secondary | ICD-10-CM | POA: Diagnosis not present

## 2021-01-27 ENCOUNTER — Telehealth: Payer: Self-pay

## 2021-01-27 DIAGNOSIS — L709 Acne, unspecified: Secondary | ICD-10-CM

## 2021-01-27 NOTE — Telephone Encounter (Signed)
Pt is requesting a referral to a dermatologist. She is not sure how the process works

## 2021-01-27 NOTE — Telephone Encounter (Signed)
Spoke to pt asked her if she wanted the Derm referral for acne? Pt said yes. Told her okay will place the referral and someone will contact you to schedule an appt. Pt verbalized understanding.

## 2021-02-02 MED FILL — SUMATRIPTAN SUCC 100 MG TAB: 100 | 30 days supply | Qty: 9 | Fill #2

## 2021-02-10 ENCOUNTER — Ambulatory Visit (INDEPENDENT_AMBULATORY_CARE_PROVIDER_SITE_OTHER): Payer: No Typology Code available for payment source | Admitting: Psychology

## 2021-02-10 DIAGNOSIS — F411 Generalized anxiety disorder: Secondary | ICD-10-CM | POA: Diagnosis not present

## 2021-03-01 ENCOUNTER — Ambulatory Visit (INDEPENDENT_AMBULATORY_CARE_PROVIDER_SITE_OTHER): Payer: No Typology Code available for payment source | Admitting: Psychology

## 2021-03-01 DIAGNOSIS — F411 Generalized anxiety disorder: Secondary | ICD-10-CM | POA: Diagnosis not present

## 2021-03-14 ENCOUNTER — Ambulatory Visit: Payer: No Typology Code available for payment source | Admitting: Psychology

## 2021-04-25 ENCOUNTER — Other Ambulatory Visit (HOSPITAL_COMMUNITY): Payer: Self-pay

## 2021-04-25 ENCOUNTER — Other Ambulatory Visit: Payer: Self-pay | Admitting: Physician Assistant

## 2021-04-25 MED ORDER — SUMATRIPTAN SUCCINATE 100 MG PO TABS
100.0000 mg | ORAL_TABLET | ORAL | 1 refills | Status: DC | PRN
  Filled 2021-04-25 – 2021-04-29 (×2): qty 9, 30d supply, fill #0
  Filled 2021-06-15 (×2): qty 9, 30d supply, fill #1
  Filled 2021-08-01: qty 2, 1d supply, fill #2

## 2021-04-25 MED FILL — Sumatriptan Succinate Tab 100 MG: ORAL | Qty: 10 | Fill #0 | Status: CN

## 2021-04-29 ENCOUNTER — Other Ambulatory Visit (HOSPITAL_COMMUNITY): Payer: Self-pay

## 2021-05-02 ENCOUNTER — Ambulatory Visit (INDEPENDENT_AMBULATORY_CARE_PROVIDER_SITE_OTHER): Payer: No Typology Code available for payment source | Admitting: Family

## 2021-05-02 ENCOUNTER — Other Ambulatory Visit: Payer: Self-pay

## 2021-05-02 ENCOUNTER — Encounter: Payer: Self-pay | Admitting: Family

## 2021-05-02 DIAGNOSIS — R0789 Other chest pain: Secondary | ICD-10-CM

## 2021-05-02 DIAGNOSIS — F32A Depression, unspecified: Secondary | ICD-10-CM | POA: Diagnosis not present

## 2021-05-02 DIAGNOSIS — F419 Anxiety disorder, unspecified: Secondary | ICD-10-CM

## 2021-05-02 DIAGNOSIS — R030 Elevated blood-pressure reading, without diagnosis of hypertension: Secondary | ICD-10-CM | POA: Diagnosis not present

## 2021-05-02 NOTE — Progress Notes (Signed)
Acute Office Visit  Subjective:    Patient ID: Sydney Blankenship, female    DOB: [birth year], 25 y.o.   MRN: [id number]  Chief Complaint  Patient presents with  . c/o elevated blood pressure    Pt has been c/o elevated blood pressure all weekend long, systolic 794'I, diastolic 01-65. Bp was 150/100 last Thursday at work.     HPI Patient is in today with concerns of persistently elevated blood pressure. She reports it being worse since she has been on night shift the past 2 months. She also reports that at night she has a heaviness in her chest that she has never had before. She is concerned that her body is not doing well on night shift. Admits to anxiety that she has not treated with medication. She exercises routinely and tries to watch her diet. She is actively training for a 1/2 marathon. She has a family history of HTN in both parents and cardiovascular disease. Not interested in taking any medication for her blood pressure at this time.   Past Medical History:  Diagnosis Date  . Allergy   . Ovarian cyst    8th grade    Past Surgical History:  Procedure Laterality Date  . OTHER SURGICAL HISTORY     No surgical history     Family History  Problem Relation Age of Onset  . Osteoarthritis Mother   . Thyroid cancer Mother   . Crohn's disease Sister   . Lung cancer Maternal Grandmother   . Kidney disease Maternal Grandfather   . Heart attack Paternal Grandfather   . Heart disease Paternal Grandfather     Social History   Socioeconomic History  . Marital status: Single    Spouse name: Not on file  . Number of children: Not on file  . Years of education: Not on file  . Highest education level: Not on file  Occupational History  . Not on file  Tobacco Use  . Smoking status: Never Smoker  . Smokeless tobacco: Never Used  Vaping Use  . Vaping Use: Never used  Substance and Sexual Activity  . Alcohol use: Yes    Alcohol/week: 2.0 standard drinks    Types: 1  Glasses of wine, 1 Cans of beer per week  . Drug use: Never  . Sexual activity: Yes    Birth control/protection: Condom, Diaphragm  Other Topics Concern  . 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 ibuprofen (ADVIL,MOTRIN) 200 MG tablet Take 400 mg by mouth every 6 (six) hours as needed.     . minocycline (MINOCIN) 100 MG capsule Take 100 mg by mouth 2 (two) times daily.    . SUMAtriptan (IMITREX) 100 MG tablet TAKE 1 TABLET (100 MG TOTAL) BY MOUTH EVERY 2 (TWO) HOURS AS NEEDED FOR MIGRAINE. MAY REPEAT IN 2 HOURS IF HEADACHE PERSISTS OR RECURS. 10 tablet 1  . SUMAtriptan (IMITREX) 100 MG tablet Take 1 tablet (100 mg total) by mouth every 2 (two) hours as needed for migraine. May  repeat in 2 hours if headache persists or recurs. 10 tablet 2  . SUMAtriptan (IMITREX) 100 MG tablet TAKE 1 TABLET BY MOUTH EVERY 2 HOURS AS NEEDED FOR MIGRAINE. MAY REPEAT IN 2 HOURS IF HEADACHE PERSISTS OR RECURS. 10 tablet 0   No facility-administered medications prior to visit.    No Known Allergies  Review of Systems     Objective:    Physical Exam  BP 140/90 (BP Location: Left Arm, Patient Position: Sitting, Cuff Size: Normal)   Pulse 66   Temp 97.6 F (36.4 C) (Temporal)   Ht _0  (1.651 m)   Wt 134 lb 6.1 oz (61 kg)   LMP 04/14/2021   SpO2 99%   BMI 22.36 kg/m  Wt Readings from Last 3 Encounters:  05/02/21 134 lb 6.1 oz (61 kg)  10/18/20 133 lb (60.3 kg)  09/29/15 115 lb (52.2 kg) (26 %, Z= -0.63)*   * Growth percentiles are based on CDC (Girls, 2-20 Years) data.     Health Maintenance Due  Topic Date Due  . HPV VACCINES (1 - 2-dose series) Never done  . COVID-19 Vaccine (3 - Booster for Moderna series) 06/19/2020  . PAP-Cervical Cytology Screening  05/24/2021  . PAP SMEAR-Modifier  05/24/2021       Topic Date Due  . HPV VACCINES (1 - 2-dose series) Never done     Lab Results  Component Value Date   TSH 3.68 10/18/2020   Lab Results  Component Value Date   WBC 10.0 10/18/2020   HGB 15.5 10/18/2020   HCT 45.4 (H) 10/18/2020   MCV 88.2 10/18/2020   PLT 225 10/18/2020   Lab Results  Component Value Date   NA 137 10/18/2020   K 4.1 10/18/2020   CO2 26 10/18/2020   GLUCOSE 79 10/18/2020   BUN 14 10/18/2020   CREATININE 0.91 10/18/2020   BILITOT 2.2 (H) 10/28/2020   AST 18 10/28/2020   ALT 14 10/28/2020   PROT 7.2 10/28/2020   CALCIUM 9.7 10/18/2020   Lab Results  Component Value Date   CHOL 150 10/18/2020   Lab Results  Component Value Date   HDL 46 (L) 10/18/2020   Lab Results  Component Value Date   LDLCALC 76 10/18/2020   Lab Results  Component Value Date   TRIG 180 (H) 10/18/2020   Lab Results  Component Value Date   CHOLHDL 3.3 10/18/2020   No results found for: HGBA1C     Assessment & Plan:   Problem List Items Addressed This Visit   None   Visit Diagnoses    Elevated blood pressure reading       Relevant Orders   CBC w/Diff   Comp Met (CMET)   Chest heaviness       Relevant Orders   EKG 12-Lead (Completed)   Thyroid Panel With TSH   CBC w/Diff   Comp Met (CMET)   Anxiety and depression       Relevant Orders   Thyroid Panel With TSH   CBC w/Diff   Comp Met (CMET)     Symptoms appear to be related to true HTN due to family history and she also appears to be dealing with anxiety that is under-treated. Continue with exercise and healthy Call the office with any questions or concerns. Recheck as scheduled and sooner as needed.   No orders of the defined types were placed in this  encounter.    Kennyth Arnold, FNP

## 2021-05-02 NOTE — Patient Instructions (Signed)

## 2021-05-03 LAB — CBC WITH DIFFERENTIAL/PLATELET
Basophils Absolute: 0.1 10*3/uL (ref 0.0–0.1)
Basophils Relative: 1 % (ref 0.0–3.0)
Eosinophils Absolute: 0.4 10*3/uL (ref 0.0–0.7)
Eosinophils Relative: 6.8 % — ABNORMAL HIGH (ref 0.0–5.0)
HCT: 40.8 % (ref 36.0–46.0)
Hemoglobin: 14.2 g/dL (ref 12.0–15.0)
Lymphocytes Relative: 19.8 % (ref 12.0–46.0)
Lymphs Abs: 1.2 10*3/uL (ref 0.7–4.0)
MCHC: 34.7 g/dL (ref 30.0–36.0)
MCV: 87.6 fl (ref 78.0–100.0)
Monocytes Absolute: 0.6 10*3/uL (ref 0.1–1.0)
Monocytes Relative: 9.1 % (ref 3.0–12.0)
Neutro Abs: 3.8 10*3/uL (ref 1.4–7.7)
Neutrophils Relative %: 63.3 % (ref 43.0–77.0)
Platelets: 197 10*3/uL (ref 150.0–400.0)
RBC: 4.66 Mil/uL (ref 3.87–5.11)
RDW: 12.7 % (ref 11.5–15.5)
WBC: 6.1 10*3/uL (ref 4.0–10.5)

## 2021-05-03 LAB — COMPREHENSIVE METABOLIC PANEL
ALT: 14 U/L (ref 0–35)
AST: 19 U/L (ref 0–37)
Albumin: 4.5 g/dL (ref 3.5–5.2)
Alkaline Phosphatase: 47 U/L (ref 39–117)
BUN: 19 mg/dL (ref 6–23)
CO2: 26 mEq/L (ref 19–32)
Calcium: 9.3 mg/dL (ref 8.4–10.5)
Chloride: 103 mEq/L (ref 96–112)
Creatinine, Ser: 0.85 mg/dL (ref 0.40–1.20)
GFR: 95.76 mL/min (ref >60.00)
Glucose, Bld: 84 mg/dL (ref 70–99)
Potassium: 3.7 mEq/L (ref 3.5–5.1)
Sodium: 139 mEq/L (ref 135–145)
Total Bilirubin: 1.4 mg/dL — ABNORMAL HIGH (ref 0.2–1.2)
Total Protein: 7.2 g/dL (ref 6.0–8.3)

## 2021-05-03 LAB — THYROID PANEL WITH TSH
Free Thyroxine Index: 1.9 (ref 1.4–3.8)
T3 Uptake: 30 % (ref 22–35)
T4, Total: 6.3 ug/dL (ref 5.1–11.9)
TSH: 3.83 mIU/L

## 2021-06-03 ENCOUNTER — Encounter: Payer: Self-pay | Admitting: Physician Assistant

## 2021-06-03 ENCOUNTER — Other Ambulatory Visit (HOSPITAL_COMMUNITY): Payer: Self-pay

## 2021-06-03 ENCOUNTER — Ambulatory Visit (INDEPENDENT_AMBULATORY_CARE_PROVIDER_SITE_OTHER): Payer: No Typology Code available for payment source | Admitting: Physician Assistant

## 2021-06-03 ENCOUNTER — Other Ambulatory Visit: Payer: Self-pay

## 2021-06-03 VITALS — BP 130/88 | HR 61 | Temp 98.0°F | Ht 65.0 in | Wt 135.2 lb

## 2021-06-03 DIAGNOSIS — L700 Acne vulgaris: Secondary | ICD-10-CM

## 2021-06-03 DIAGNOSIS — F32A Depression, unspecified: Secondary | ICD-10-CM

## 2021-06-03 DIAGNOSIS — I10 Essential (primary) hypertension: Secondary | ICD-10-CM | POA: Diagnosis not present

## 2021-06-03 DIAGNOSIS — F419 Anxiety disorder, unspecified: Secondary | ICD-10-CM

## 2021-06-03 MED ORDER — SPIRONOLACTONE 25 MG PO TABS
25.0000 mg | ORAL_TABLET | Freq: Every day | ORAL | 0 refills | Status: DC
  Filled 2021-06-03: qty 30, 30d supply, fill #0

## 2021-06-03 NOTE — Patient Instructions (Signed)
Take the Spironolactone as directed. Call if any problems with this medication. We are hoping this will treat your HTN and acne.   Keep a log of BP's 2-3x / week and bring to next appointment.  Good job with healthy lifestyle!!   Look into meditation and breathing techniques for anxiety.

## 2021-06-03 NOTE — Progress Notes (Signed)
Acute Office Visit  Subjective:    Patient ID: Sydney Blankenship, female    DOB: December 25, 1995, 25 y.o.   MRN: [id number]  Chief Complaint  Patient presents with   Acne   Hypertension    HPI Patient is in today for elevated blood pressure readings.  Patient states that she was seen on 05/02/2021 with our office and had blood work and EKG done, which was all normal at that time.  She was not started on any medication.  She notes that she is still having an average of 140s/ 80s daily for her blood pressure.  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  160/90 has been her highest reading while she was sitting at work. Mother has a history of HTN, 150/90 is average for her. Father's side has cardiac history.  Actively training for 1/2 marathon currently and feels well during her exercises.  Not on birth control. Does not drink or smoke or use illicit drugs. She only takes NSAIDs during her cycle. No hx of anemia. Sleeps well, no snoring or OSA she is aware of. No decongestants. No high-sodium in diet.  She does admit to feeling more anxious lately.  She would also like to get treatment for her acne or have a referral to dermatology today.  Mostly affects her face.  She has done well with minocycline in the past previously.   Past Medical History:  Diagnosis Date   Allergy    Ovarian cyst    8th grade    Past Surgical History:  Procedure Laterality Date   OTHER SURGICAL HISTORY     No surgical history     Family History  Problem Relation Age of Onset   Osteoarthritis Mother    Thyroid cancer Mother    Crohn's disease Sister    Lung cancer Maternal Grandmother    Kidney disease Maternal Grandfather    Heart attack Paternal Grandfather    Heart disease Paternal Grandfather     Social History   Socioeconomic History   Marital status: Single    Spouse name: Not on file   Number of children: Not  on file   Years of education: Not on file   Highest education level: Not on file  Occupational History   Not on file  Tobacco Use   Smoking status: Never   Smokeless tobacco: Never  Vaping Use   Vaping Use: Never used  Substance and Sexual Activity   Alcohol use: Yes    Alcohol/week: 2.0 standard drinks    Types: 1 Glasses of wine, 1 Cans of beer per week   Drug use: Never   Sexual activity: Yes    Birth control/protection: Condom, Diaphragm  Other Topics Concern   Not on file  Social History Narrative   Psychologist, 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      minocycline (MINOCIN) 100 MG  capsule Take 100 mg by mouth 2 (two) times daily.     SUMAtriptan (IMITREX) 100 MG tablet TAKE 1 TABLET (100 MG TOTAL) BY MOUTH EVERY 2 (TWO) HOURS AS NEEDED FOR MIGRAINE. MAY REPEAT IN 2 HOURS IF HEADACHE PERSISTS OR RECURS. 10 tablet 1   No facility-administered medications prior to visit.    No Known Allergies  Review of Systems  Constitutional:  Negative for activity change, appetite change, fever and unexpected weight change.  HENT:  Negative for congestion.   Eyes:  Negative for visual disturbance.  Respiratory:  Negative for apnea, cough and shortness of breath.   Cardiovascular:  Negative for chest pain, palpitations and leg swelling.  Gastrointestinal:  Negative for abdominal pain, blood in stool, constipation and diarrhea.  Endocrine: Negative for polydipsia, polyphagia and polyuria.  Genitourinary:  Negative for  dysuria and pelvic pain.  Musculoskeletal:  Negative for arthralgias.  Skin:  Negative for rash.  Neurological:  Negative for dizziness, weakness and headaches.  Hematological:  Negative for adenopathy. Does not bruise/bleed easily.  Psychiatric/Behavioral:  Negative for sleep disturbance and suicidal ideas. The patient is nervous/anxious (occasionally).        Objective:    Physical Exam Vitals and nursing note reviewed.  Constitutional:      Appearance: Normal appearance. She is normal weight. She is not toxic-appearing.  HENT:     Head: Normocephalic and atraumatic.     Right Ear: External ear normal.     Left Ear: External ear normal.     Nose: Nose normal.     Mouth/Throat:     Mouth: Mucous membranes are moist.  Eyes:     Extraocular Movements: Extraocular movements intact.     Conjunctiva/sclera: Conjunctivae normal.     Pupils: Pupils are equal, round, and reactive to light.  Cardiovascular:     Rate and Rhythm: Normal rate and regular rhythm.     Pulses: Normal pulses.     Heart sounds: Normal heart sounds.  Pulmonary:     Effort: Pulmonary effort is normal.     Breath sounds: Normal breath sounds.  Abdominal:     General: Abdomen is flat. Bowel sounds are normal.     Palpations: Abdomen is soft.  Musculoskeletal:        General: Normal range of motion.     Cervical back: Normal range of motion and neck supple.  Skin:    General: Skin is warm and dry.     Findings: Acne (facial acne present mostly around chin) present.  Neurological:     General: No focal deficit present.     Mental Status: She is alert and oriented to person, place, and time.  Psychiatric:        Mood and Affect: Mood normal.        Behavior: Behavior normal.        Thought Content: Thought content normal.        Judgment: Judgment normal.    BP 130/88   Pulse 61   Temp 98 F (36.7 C)   Ht 5\' 5"  (1.651 m)   Wt 135 lb 3.2 oz (61.3 kg)   SpO2 100%   BMI 22.50 kg/m  Wt Readings from  Last 3 Encounters:  06/03/21 135 lb 3.2 oz (61.3 kg)  05/02/21 134 lb 6.1 oz (61 kg)  10/18/20 133 lb (60.3 kg)    Health Maintenance Due  Topic Date Due   HPV VACCINES (1 - 2-dose series) Never done   COVID-19  Vaccine (3 - Booster for Moderna series) 06/19/2020   PAP-Cervical Cytology Screening  05/24/2021   PAP SMEAR-Modifier  05/24/2021       Topic Date Due   HPV VACCINES (1 - 2-dose series) Never done     Lab Results  Component Value Date   TSH 3.83 05/02/2021   Lab Results  Component Value Date   WBC 6.1 05/02/2021   HGB 14.2 05/02/2021   HCT 40.8 05/02/2021   MCV 87.6 05/02/2021   PLT 197.0 05/02/2021   Lab Results  Component Value Date   NA 139 05/02/2021   K 3.7 05/02/2021   CO2 26 05/02/2021   GLUCOSE 84 05/02/2021   BUN 19 05/02/2021   CREATININE 0.85 05/02/2021   BILITOT 1.4 (H) 05/02/2021   ALKPHOS 47 05/02/2021   AST 19 05/02/2021   ALT 14 05/02/2021   PROT 7.2 05/02/2021   ALBUMIN 4.5 05/02/2021   CALCIUM 9.3 05/02/2021   GFR 95.76 05/02/2021   Lab Results  Component Value Date   CHOL 150 10/18/2020   Lab Results  Component Value Date   HDL 46 (L) 10/18/2020   Lab Results  Component Value Date   LDLCALC 76 10/18/2020   Lab Results  Component Value Date   TRIG 180 (H) 10/18/2020   Lab Results  Component Value Date   CHOLHDL 3.3 10/18/2020   No results found for: HGBA1C     Assessment & Plan:   Problem List Items Addressed This Visit   None Visit Diagnoses     Essential hypertension    -  Primary   Relevant Medications   spironolactone (ALDACTONE) 25 MG tablet   Acne vulgaris       Anxiety and depression            Meds ordered this encounter  Medications   spironolactone (ALDACTONE) 25 MG tablet    Sig: Take 1 tablet (25 mg total) by mouth daily.    Dispense:  30 tablet    Refill:  0   1. Essential hypertension 2. Acne vulgaris 3. Anxiety and depression I reviewed the patient's last visit in May with [place] and  personally reviewed her labs and EKG.  Everything was unremarkable.  I do think there is a familial component to her issues with hypertension.  Stress and anxiety may also be a factor for her.  I recommended starting on spironolactone 25 mg once daily to see if this will help her blood pressure readings, as well as the issues with her acne.  I do not think she needs to start an SSRI just yet, but we will continue to revisit this as well.  She has tried therapy before and may look into this again.  I encouraged her to keep up the good work with her healthy lifestyle.  This note was prepared with assistance of Conservation officer, historic buildings. Occasional wrong-word or sound-a-like substitutions may have occurred due to the inherent limitations of voice recognition software.  Total time spent today reviewing chart and discussing with patient as well as dictation was 38 minutes.  Evora Schechter M Irisa Grimsley, PA-C

## 2021-06-15 ENCOUNTER — Other Ambulatory Visit (HOSPITAL_COMMUNITY): Payer: Self-pay

## 2021-06-23 ENCOUNTER — Other Ambulatory Visit: Payer: Self-pay | Admitting: Physician Assistant

## 2021-06-23 ENCOUNTER — Other Ambulatory Visit (HOSPITAL_COMMUNITY): Payer: Self-pay

## 2021-07-01 ENCOUNTER — Ambulatory Visit: Payer: No Typology Code available for payment source | Admitting: Physician Assistant

## 2021-07-19 ENCOUNTER — Other Ambulatory Visit (HOSPITAL_COMMUNITY): Payer: Self-pay

## 2021-07-19 MED ORDER — CARESTART COVID-19 HOME TEST VI KIT
PACK | 0 refills | Status: DC
  Filled 2021-07-19: qty 4, 4d supply, fill #0

## 2021-08-01 ENCOUNTER — Other Ambulatory Visit (HOSPITAL_COMMUNITY): Payer: Self-pay

## 2021-08-02 ENCOUNTER — Other Ambulatory Visit: Payer: Self-pay | Admitting: Family Medicine

## 2021-08-02 ENCOUNTER — Other Ambulatory Visit (HOSPITAL_COMMUNITY): Payer: Self-pay

## 2021-08-02 MED ORDER — SUMATRIPTAN SUCCINATE 100 MG PO TABS
100.0000 mg | ORAL_TABLET | ORAL | 1 refills | Status: DC | PRN
  Filled 2021-08-02 – 2021-08-05 (×3): qty 9, 30d supply, fill #0
  Filled 2021-09-27: qty 9, 30d supply, fill #1
  Filled 2021-11-09: qty 9, 30d supply, fill #2

## 2021-08-04 ENCOUNTER — Other Ambulatory Visit (HOSPITAL_COMMUNITY): Payer: Self-pay

## 2021-08-05 ENCOUNTER — Other Ambulatory Visit (HOSPITAL_COMMUNITY): Payer: Self-pay

## 2021-08-19 ENCOUNTER — Telehealth: Payer: Self-pay

## 2021-08-19 NOTE — Telephone Encounter (Signed)
ERROR

## 2021-08-24 ENCOUNTER — Ambulatory Visit (INDEPENDENT_AMBULATORY_CARE_PROVIDER_SITE_OTHER): Payer: No Typology Code available for payment source | Admitting: Physician Assistant

## 2021-08-24 ENCOUNTER — Other Ambulatory Visit: Payer: Self-pay

## 2021-08-24 ENCOUNTER — Other Ambulatory Visit (HOSPITAL_COMMUNITY): Payer: Self-pay

## 2021-08-24 ENCOUNTER — Encounter: Payer: Self-pay | Admitting: Physician Assistant

## 2021-08-24 VITALS — BP 131/91 | HR 75 | Temp 98.6°F | Ht 65.0 in | Wt 138.2 lb

## 2021-08-24 DIAGNOSIS — L700 Acne vulgaris: Secondary | ICD-10-CM | POA: Diagnosis not present

## 2021-08-24 MED ORDER — MINOCYCLINE HCL 100 MG PO CAPS
100.0000 mg | ORAL_CAPSULE | Freq: Two times a day (BID) | ORAL | 0 refills | Status: DC
  Filled 2021-08-24: qty 180, 90d supply, fill #0

## 2021-08-24 MED ORDER — CLINDAMYCIN PHOSPHATE 1 % EX SWAB
1.0000 | Freq: Two times a day (BID) | CUTANEOUS | 0 refills | Status: DC
  Filled 2021-08-24: qty 60, 30d supply, fill #0

## 2021-08-24 MED ORDER — CLINDAMYCIN PHOS-BENZOYL PEROX 1-5 % EX GEL
Freq: Two times a day (BID) | CUTANEOUS | 0 refills | Status: DC
  Filled 2021-08-24: qty 25, 30d supply, fill #0

## 2021-08-24 NOTE — Patient Instructions (Signed)
It was great to see you!  Minocycline sent in  Benzaclin topical   Referral to dermatology  Check out r/[email or web address]  Take care,  Jarold Motto PA-C

## 2021-08-24 NOTE — Progress Notes (Signed)
Sydney Blankenship is a 25 y.o. female here for a follow up of a pre-existing problem.  History of Present Illness:   Chief Complaint  Patient presents with   Referral    Dermatology    Dermatology Referral Pt is here for a referral. She had been taking minocycline for 6 months for acne, and took 2x 100 mg a day. It seemed to do well, but she ran out. Currently, she is using a topical OTC products. May have used benzaclin in the past. Was put on spironolactone by my colleague but didn't find this to be very helpful.  Denies concerns for pregnancy.  HPI   Past Medical History:  Diagnosis Date   Allergy    Ovarian cyst    8th grade     Social History   Tobacco Use   Smoking status: Never   Smokeless tobacco: Never  Vaping Use   Vaping Use: Never used  Substance Use Topics   Alcohol use: Yes    Alcohol/week: 2.0 standard drinks    Types: 1 Glasses of wine, 1 Cans of beer per week   Drug use: Never    Past Surgical History:  Procedure Laterality Date   OTHER SURGICAL HISTORY     No surgical history     Family History  Problem Relation Age of Onset   Osteoarthritis Mother    Thyroid cancer Mother    Crohn's disease Sister    Lung cancer Maternal Grandmother    Kidney disease Maternal Grandfather    Heart attack Paternal Grandfather    Heart disease Paternal Grandfather     No Known Allergies  Current Medications:   Current Outpatient Medications:    clindamycin (CLEOCIN T) 1 % SWAB, Apply 1 each topically 2 (two) times daily., Disp: 60 each, Rfl: 0   ibuprofen (ADVIL,MOTRIN) 200 MG tablet, Take 400 mg by mouth every 6 (six) hours as needed. , Disp: , Rfl:    minocycline (MINOCIN) 100 MG capsule, Take 1 capsule (100 mg total) by mouth 2 (two) times daily., Disp: 180 capsule, Rfl: 0   SUMAtriptan (IMITREX) 100 MG tablet, TAKE 1 TABLET (100 MG TOTAL) BY MOUTH EVERY 2 (TWO) HOURS AS NEEDED FOR MIGRAINE. MAY REPEAT IN 2 HOURS IF HEADACHE PERSISTS OR RECURS.,  Disp: 10 tablet, Rfl: 1   Review of Systems:   ROS Negative unless otherwise specified per HPI.  Vitals:   Vitals:   08/24/21 1456  BP: (!) 131/91  Pulse: 75  Temp: 98.6 F (37 C)  TempSrc: Temporal  SpO2: 99%  Weight: 138 lb 3.2 oz (62.7 kg)  Height: 5\' 5"  (1.651 m)     Body mass index is 23 kg/m.  Physical Exam:   Physical Exam Constitutional:      Appearance: Normal appearance. She is well-developed.  HENT:     Head: Normocephalic and atraumatic.  Eyes:     General: Lids are normal.     Extraocular Movements: Extraocular movements intact.     Conjunctiva/sclera: Conjunctivae normal.  Pulmonary:     Effort: Pulmonary effort is normal.  Musculoskeletal:        General: Normal range of motion.     Cervical back: Normal range of motion and neck supple.  Skin:    General: Skin is warm and dry.     Comments: Multiple scattered pustules across face.  Neurological:     Mental Status: She is alert and oriented to person, place, and time.  Psychiatric:  Attention and Perception: Attention and perception normal.        Mood and Affect: Mood normal.        Behavior: Behavior normal.        Thought Content: Thought content normal.        Judgment: Judgment normal.    Assessment and Plan:   @DIAGLIST @     I,Jordan Kelly,acting as a scribe for , PA.,have documented all relevant documentation on the behalf of [employer], PA,as directed by  Jarold Motto, PA while in the presence of Jarold Motto, Jarold Motto.  I, [place], Jarold Motto, have reviewed all documentation for this visit. The documentation on 08/24/21 for the exam, diagnosis, procedures, and orders are all accurate and complete.   08/26/21, PA-C

## 2021-09-27 ENCOUNTER — Other Ambulatory Visit (HOSPITAL_COMMUNITY): Payer: Self-pay

## 2021-09-28 ENCOUNTER — Other Ambulatory Visit (HOSPITAL_COMMUNITY): Payer: Self-pay

## 2021-10-17 ENCOUNTER — Encounter: Payer: Self-pay | Admitting: Physician Assistant

## 2021-11-09 ENCOUNTER — Other Ambulatory Visit (HOSPITAL_COMMUNITY): Payer: Self-pay

## 2021-11-09 ENCOUNTER — Other Ambulatory Visit: Payer: Self-pay | Admitting: Physician Assistant

## 2021-11-09 MED ORDER — SUMATRIPTAN SUCCINATE 100 MG PO TABS
100.0000 mg | ORAL_TABLET | ORAL | 1 refills | Status: DC | PRN
  Filled 2021-11-09: qty 9, 30d supply, fill #0
  Filled 2021-12-19: qty 9, 30d supply, fill #1
  Filled 2022-01-18: qty 2, 10d supply, fill #2

## 2021-11-16 NOTE — Progress Notes (Signed)
Sydney Blankenship is a 25 y.o. female here for anxiety.  History of Present Illness:   Chief Complaint  Patient presents with   Follow-up    Anxiety, pt has a lot of tasks and such that she want to discuss with Sam.     HPI  Anxiety [name] presents with c/o increasing anxiety over the past couple of weeks. According to pt, she believes it's time for her to finally start medication after putting this off for years. [name] states she has been having more anxiety attacks, crying spells, decreased libido, and increased spacing out. 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   As far as medication goes, she is looking for something that'll help her become more level headed. In the past, pt has participated in talk therapy through [facility], but stopped a couple of months later. She is interested in restarting talk therapy with someone new at this time. Denies SI/HI.  Past Medical History:  Diagnosis Date   Allergy    Ovarian cyst    8th grade     Social History   Tobacco Use   Smoking status: Never   Smokeless tobacco: Never  Vaping Use   Vaping Use: Never used  Substance Use Topics   Alcohol use: Yes    Alcohol/week: 2.0 standard drinks    Types: 1 Glasses of wine, 1 Cans of beer per week   Drug use: Never    Past Surgical History:  Procedure Laterality Date   OTHER SURGICAL HISTORY     No surgical history     Family History  Problem Relation Age of Onset   Osteoarthritis Mother    Thyroid cancer Mother    Crohn's disease Sister    Lung cancer Maternal Grandmother    Kidney disease Maternal Grandfather    Heart attack Paternal Grandfather    Heart disease Paternal Grandfather     No Known Allergies  Current Medications:   Current Outpatient Medications:    clindamycin (CLEOCIN T) 1 % SWAB, Apply  topically 2 (two) times daily., Disp: 60 each, Rfl: 0    FLUoxetine (PROZAC) 20 MG capsule, Take 1 capsule (20 mg total) by mouth daily., Disp: 30 capsule, Rfl: 1   ibuprofen (ADVIL,MOTRIN) 200 MG tablet, Take 400 mg by mouth every 6 (six) hours as needed. , Disp: , Rfl:    minocycline (MINOCIN) 100 MG capsule, Take 1 capsule (100 mg total) by mouth 2 (two) times daily., Disp: 180 capsule, Rfl: 0   SUMAtriptan (IMITREX) 100 MG tablet, TAKE 1 TABLET (100 MG TOTAL) BY MOUTH EVERY 2 (TWO) HOURS AS NEEDED FOR MIGRAINE. MAY REPEAT IN 2 HOURS IF HEADACHE PERSISTS OR RECURS., Disp: 10 tablet, Rfl: 1   Review of Systems:   ROS  Negative unless otherwise specified per HPI. Vitals:   Vitals:   11/17/21 0927  BP: 124/68  Pulse: 72  Temp: 98 F (36.7 C)  TempSrc: Temporal  SpO2: 99%  Weight: 137 lb (62.1 kg)     Body mass index is 22.8 kg/m.  Physical Exam:   Physical Exam Constitutional:      Appearance: Normal appearance. She is well-developed.  HENT:     Head: Normocephalic and atraumatic.  Eyes:     General: Lids are normal.     Extraocular Movements: Extraocular movements intact.     Conjunctiva/sclera: Conjunctivae normal.  Pulmonary:  Effort: Pulmonary effort is normal.  Musculoskeletal:        General: Normal range of motion.     Cervical back: Normal range of motion and neck supple.  Skin:    General: Skin is warm and dry.  Neurological:     Mental Status: She is alert and oriented to person, place, and time.  Psychiatric:        Attention and Perception: Attention and perception normal.        Mood and Affect: Mood normal.        Behavior: Behavior normal.        Thought Content: Thought content normal.        Judgment: Judgment normal.    Assessment and Plan:   Anxiety Uncontrolled Start Prozac 20 mg daily Advised patient to stop use of medication if symptoms worsens or effects occur and to reach out immediately Provided talk therapy recommendations I also advised that if they develop any SI, to tell someone  immediately and seek medical attention; Denied SI/HI at today's visit Follow up in 1 month, sooner if concerns occur   I,Havlyn C Ratchford,acting as a scribe for [employer], PA.,have documented all relevant documentation on the behalf of Jarold Motto, PA,as directed by  Jarold Motto, PA while in the presence of Jarold Motto, [place].  I, Jarold Motto, [place], have reviewed all documentation for this visit. The documentation on 11/17/21 for the exam, diagnosis, procedures, and orders are all accurate and complete.   Jarold Motto, PA-C

## 2021-11-17 ENCOUNTER — Other Ambulatory Visit (HOSPITAL_COMMUNITY): Payer: Self-pay

## 2021-11-17 ENCOUNTER — Ambulatory Visit (INDEPENDENT_AMBULATORY_CARE_PROVIDER_SITE_OTHER): Payer: No Typology Code available for payment source | Admitting: Physician Assistant

## 2021-11-17 ENCOUNTER — Other Ambulatory Visit: Payer: Self-pay

## 2021-11-17 ENCOUNTER — Encounter: Payer: Self-pay | Admitting: Physician Assistant

## 2021-11-17 VITALS — BP 124/68 | HR 72 | Temp 98.0°F | Wt 137.0 lb

## 2021-11-17 DIAGNOSIS — F411 Generalized anxiety disorder: Secondary | ICD-10-CM

## 2021-11-17 MED ORDER — FLUOXETINE HCL 20 MG PO CAPS
20.0000 mg | ORAL_CAPSULE | Freq: Every day | ORAL | 1 refills | Status: DC
  Filled 2021-11-17: qty 30, 30d supply, fill #0

## 2021-11-17 NOTE — Patient Instructions (Signed)
It was great to see you!  Therapy recommendations: Spero Geralds: [email or web address] Carollee Herter Englehorn: [email or web address] *Look into those and if you'd like a [facility] provider, just let me know   If you develop suicidal thoughts, please tell someone and immediately proceed to our local 24/7 crisis center, [facility]. [address], [place], [place] [address] [phone].   Let's follow-up in 1 month, sooner if you have concerns.  Take care,  Jarold Motto PA-C

## 2021-11-21 ENCOUNTER — Ambulatory Visit: Payer: No Typology Code available for payment source | Admitting: Physician Assistant

## 2021-12-19 ENCOUNTER — Other Ambulatory Visit: Payer: Self-pay

## 2021-12-19 ENCOUNTER — Other Ambulatory Visit (HOSPITAL_COMMUNITY): Payer: Self-pay

## 2021-12-19 ENCOUNTER — Encounter: Payer: Self-pay | Admitting: Physician Assistant

## 2021-12-19 ENCOUNTER — Telehealth (INDEPENDENT_AMBULATORY_CARE_PROVIDER_SITE_OTHER): Payer: No Typology Code available for payment source | Admitting: Physician Assistant

## 2021-12-19 VITALS — Ht 65.0 in | Wt 137.0 lb

## 2021-12-19 DIAGNOSIS — F411 Generalized anxiety disorder: Secondary | ICD-10-CM

## 2021-12-19 DIAGNOSIS — L700 Acne vulgaris: Secondary | ICD-10-CM

## 2021-12-19 MED ORDER — MINOCYCLINE HCL 100 MG PO CAPS
100.0000 mg | ORAL_CAPSULE | Freq: Two times a day (BID) | ORAL | 0 refills | Status: DC
  Filled 2021-12-19: qty 180, 90d supply, fill #0

## 2021-12-19 MED ORDER — FLUOXETINE HCL 20 MG PO CAPS
20.0000 mg | ORAL_CAPSULE | Freq: Every day | ORAL | 1 refills | Status: DC
  Filled 2021-12-19: qty 90, 90d supply, fill #0
  Filled 2022-03-21: qty 90, 90d supply, fill #1

## 2021-12-19 NOTE — Progress Notes (Signed)
° °  I acted as a Neurosurgeon for [employer], PA-C Corky Mull, LPN  Virtual Visit via Video Note   I, Jarold Motto, connected with  Sydney Blankenship  ([id number], 1996/09/02) on 12/19/21 at  1:30 PM EST by a video-enabled telemedicine application and verified that I am speaking with the correct person using two identifiers.  Location: Patient: Home Provider: [facility] office   I discussed the limitations of evaluation and management by telemedicine and the availability of in person appointments. The patient expressed understanding and agreed to proceed.    History of Present Illness: Sydney Blankenship is a 26 y.o. who identifies as a female who was assigned female at birth, and is being seen today for anxiety.   Anxiety Pt was started on Prozac 20 mg daily at visit 11/17/2021. Pt says she has noticed that after 4 weeks on medication she was feeling much better. Denies panic attacks, crying spells better. Initially had some worsening migraines, but this improved with time. Going to take CMA exam soon. Denies SI/HI.  Acne She has been taking minocycline 100 mg BID for a few months now. She feels like this is really helping her symptoms. She does not feel like she needs to see a dermatologist and would like to continue this medication from our office if possible.  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 , Disp: , Rfl:    SUMAtriptan (IMITREX) 100 MG tablet, TAKE 1 TABLET (100 MG TOTAL) BY MOUTH EVERY 2 (TWO) HOURS AS NEEDED FOR MIGRAINE. MAY REPEAT IN 2 HOURS IF HEADACHE PERSISTS OR RECURS., Disp: 10 tablet, Rfl: 1   FLUoxetine (PROZAC)  20 MG capsule, Take 1 capsule (20 mg total) by mouth daily., Disp: 90 capsule, Rfl: 1   minocycline (MINOCIN) 100 MG capsule, Take 1 capsule (100 mg total) by mouth 2 (two) times daily., Disp: 180 capsule, Rfl: 0  Observations/Objective: Patient is well-developed, well-nourished in no acute distress.  Resting comfortably  at home.  Head is normocephalic, atraumatic.  No labored breathing.  Speech is clear and coherent with logical content.  Patient is alert and oriented at baseline.    Assessment and Plan: 1. GAD (generalized anxiety disorder) Well controlled Continue prozac 20 mg daily I discussed with patient that if they develop any SI, to tell someone immediately and seek medical attention. Follow-up in 6 months, sooner if concerns  2. Acne vulgaris Well controlled Continue minocycline 100 mg BID Follow-up in 3 months, sooner if concerns  Follow Up Instructions: I discussed the assessment and treatment plan with the patient. The patient was provided an opportunity to ask questions and all were answered. The patient agreed with the plan and demonstrated an understanding of the instructions.  A copy of instructions were sent to the patient via MyChart unless otherwise noted below.   The patient was advised to call back or seek an in-person evaluation if the symptoms worsen or if the condition fails to improve as anticipated.  Jarold Motto, [place]

## 2022-01-18 ENCOUNTER — Other Ambulatory Visit: Payer: Self-pay | Admitting: Physician Assistant

## 2022-01-18 ENCOUNTER — Other Ambulatory Visit (HOSPITAL_COMMUNITY): Payer: Self-pay

## 2022-01-18 MED ORDER — SUMATRIPTAN SUCCINATE 100 MG PO TABS
100.0000 mg | ORAL_TABLET | ORAL | 1 refills | Status: DC | PRN
  Filled 2022-01-18: qty 9, 30d supply, fill #0
  Filled 2022-02-16: qty 9, 30d supply, fill #1
  Filled 2022-03-17: qty 9, 30d supply, fill #2

## 2022-02-15 ENCOUNTER — Ambulatory Visit: Payer: No Typology Code available for payment source | Admitting: Dermatology

## 2022-02-16 ENCOUNTER — Other Ambulatory Visit (HOSPITAL_COMMUNITY): Payer: Self-pay

## 2022-03-17 ENCOUNTER — Other Ambulatory Visit (HOSPITAL_COMMUNITY): Payer: Self-pay

## 2022-03-17 ENCOUNTER — Other Ambulatory Visit: Payer: Self-pay | Admitting: Physician Assistant

## 2022-03-20 ENCOUNTER — Other Ambulatory Visit (HOSPITAL_COMMUNITY): Payer: Self-pay

## 2022-03-20 MED ORDER — SUMATRIPTAN SUCCINATE 100 MG PO TABS
100.0000 mg | ORAL_TABLET | ORAL | 1 refills | Status: DC | PRN
  Filled 2022-03-20: qty 9, 30d supply, fill #0
  Filled 2022-04-23: qty 9, 30d supply, fill #1

## 2022-03-21 ENCOUNTER — Other Ambulatory Visit (HOSPITAL_COMMUNITY): Payer: Self-pay

## 2022-03-23 ENCOUNTER — Other Ambulatory Visit (HOSPITAL_COMMUNITY): Payer: Self-pay

## 2022-04-23 ENCOUNTER — Other Ambulatory Visit: Payer: Self-pay | Admitting: Physician Assistant

## 2022-04-24 ENCOUNTER — Other Ambulatory Visit (HOSPITAL_COMMUNITY): Payer: Self-pay

## 2022-04-24 ENCOUNTER — Telehealth: Payer: No Typology Code available for payment source | Admitting: Physician Assistant

## 2022-04-24 MED ORDER — MINOCYCLINE HCL 100 MG PO CAPS
100.0000 mg | ORAL_CAPSULE | Freq: Two times a day (BID) | ORAL | 0 refills | Status: DC
  Filled 2022-04-24: qty 180, 90d supply, fill #0

## 2022-04-25 ENCOUNTER — Encounter: Payer: Self-pay | Admitting: Neurology

## 2022-04-25 ENCOUNTER — Other Ambulatory Visit (HOSPITAL_COMMUNITY): Payer: Self-pay

## 2022-04-25 ENCOUNTER — Encounter: Payer: Self-pay | Admitting: Physician Assistant

## 2022-04-25 ENCOUNTER — Telehealth (INDEPENDENT_AMBULATORY_CARE_PROVIDER_SITE_OTHER): Payer: No Typology Code available for payment source | Admitting: Physician Assistant

## 2022-04-25 VITALS — Ht 65.0 in | Wt 134.0 lb

## 2022-04-25 DIAGNOSIS — G43009 Migraine without aura, not intractable, without status migrainosus: Secondary | ICD-10-CM | POA: Diagnosis not present

## 2022-04-25 MED ORDER — RIZATRIPTAN BENZOATE 10 MG PO TABS
10.0000 mg | ORAL_TABLET | ORAL | 0 refills | Status: DC | PRN
  Filled 2022-04-25: qty 10, 30d supply, fill #0

## 2022-04-25 NOTE — Progress Notes (Signed)
? ?  I acted as a Neurosurgeon for [employer], PA-C ?Corky Mull, LPN ? ?Virtual Visit via Video Note  ? ?IJarold Motto, PA, connected with  Sydney Blankenship  ([id number], [birth year]) on 04/25/22 at 11:20 AM EDT by a video-enabled telemedicine application and verified that I am speaking with the correct person using two identifiers. ? ?Location: ?Patient: Home ?Provider: [facility] office ?  ?I discussed the limitations of evaluation and management by telemedicine and the availability of in person appointments. The patient expressed understanding and agreed to proceed.   ? ?History of Present Illness: ?Sydney Blankenship is a 26 y.o. who identifies as a female who was assigned female at birth, and is being seen today for migraines.  ? ?Pt is c/o increase in migraines since she started on the Prozac 3-4 months ago, increase in migraines the past 1.5 months. She has been having to use 3 Imitrex to get relief. She states that she is getting migraines about twice per month. Denies: vision changes, weakness on one side of the body, changes to stress/caffeine/sleep. ? ?She is wondering if she should see neuro. ? ?She does feel like the prozac is working quite well and is reluctant to change this at this time. ? ?Problems:  ?Patient Active Problem List  ? Diagnosis Date Noted  ? 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 Right thyroid nodule 10/18/2020  ? Iron deficiency anemia 10/18/2020  ? Recurrent epistaxis 08/07/2013  ?  ?Allergies: No Known Allergies ?Medications:  ?Current Outpatient Medications:  ?  FLUoxetine (PROZAC) 20 MG capsule, Take 1 capsule (20 mg total) by mouth daily., Disp: 90 capsule, Rfl: 1 ?  minocycline (MINOCIN) 100 MG capsule, Take 1 capsule (100 mg total) by mouth 2 (two) times daily., Disp: 180 capsule, Rfl: 0 ?  rizatriptan (MAXALT) 10 MG tablet, Take 1 tablet (10 mg total) by mouth as needed for migraine. May repeat in 2 hours if needed, Disp: 10 tablet, Rfl:  0 ? ?Observations/Objective: ?Patient is well-developed, well-nourished in no acute distress.  ?Resting comfortably  at home.  ?Head is normocephalic, atraumatic.  ?No labored breathing.  ?Speech is clear and coherent with logical content.  ?Patient is alert and oriented at baseline.  ? ?Assessment and Plan: ?1. Migraine without aura and without status migrainosus, not intractable ?- Ambulatory referral to Neurology ?No red flag symptoms ?Stop imitrex, start maxalt ?Referral placed per patient request to Neurology ?If lack of improvement in the meantime, or any worsening symptoms, recommend she reach out to [place] ?She is aware of stroke precuations ? ?Follow Up Instructions: ?I discussed the assessment and treatment plan with the patient. The patient was provided an opportunity to ask questions and all were answered. The patient agreed with the plan and demonstrated an understanding of the instructions.  A copy of instructions were sent to the patient via MyChart unless otherwise noted below.  ? ?The patient was advised to call back or seek an in-person evaluation if the symptoms worsen or if the condition fails to improve as anticipated. ? ?Jarold Motto, PA ?

## 2022-05-01 ENCOUNTER — Encounter: Payer: Self-pay | Admitting: Physician Assistant

## 2022-05-01 MED ORDER — SUMATRIPTAN SUCCINATE 100 MG PO TABS: 100.0000 mg | ORAL_TABLET | ORAL | 1 refills | Status: DC | PRN

## 2022-05-29 ENCOUNTER — Encounter: Payer: Self-pay | Admitting: Orthopedic Surgery

## 2022-05-29 ENCOUNTER — Other Ambulatory Visit: Payer: Self-pay | Admitting: Physician Assistant

## 2022-05-29 ENCOUNTER — Ambulatory Visit (INDEPENDENT_AMBULATORY_CARE_PROVIDER_SITE_OTHER): Payer: No Typology Code available for payment source | Admitting: Orthopedic Surgery

## 2022-05-29 ENCOUNTER — Other Ambulatory Visit (HOSPITAL_COMMUNITY): Payer: Self-pay

## 2022-05-29 DIAGNOSIS — S92351A Displaced fracture of fifth metatarsal bone, right foot, initial encounter for closed fracture: Secondary | ICD-10-CM | POA: Diagnosis not present

## 2022-05-29 NOTE — Progress Notes (Signed)
Office Visit Note   Patient: Sydney Blankenship           Date of Birth: December 07, 1996           MRN: [id number] Visit Date: 05/29/2022              Requested by: Jarold Motto, [place] [address] [place],  [place] [address] PCP: Jarold Motto, [place]  Chief Complaint  Patient presents with   Right Foot - Pain    DOI 05/27/2022      HPI: Patient is a 26 year old woman who is seen for initial evaluation for metaphyseal fracture base of the fifth metatarsal right foot.  Patient states she rolled her foot while walking down her driveway on Saturday she went to [facility] radiographs were obtained she was placed in a fracture boot with crutches and is seen today for initial evaluation.  Assessment & Plan: Visit Diagnoses:  1. Closed fracture of base of fifth metatarsal bone of right foot, initial encounter     Plan: Patient has a nondisplaced metaphyseal fracture she will continue with protected weightbearing and she was given a note for seated work for 3 weeks.  Also discussed that she could try a kneeling scooter.  Follow-up in 3 weeks repeat radiographs at which time we will advance her to a stiff soled shoe or sneaker or she can get a carbon plate for her sneakers.  Follow-Up Instructions: Return in about 3 weeks (around 06/19/2022).   Ortho Exam  Patient is alert, oriented, no adenopathy, well-dressed, normal affect, normal respiratory effort. Examination patient has ecchymosis and bruising over the dorsum of her foot there is swelling she has tenderness to palpation of the base of the fifth metatarsal.  There is no open wounds.  Radiographs were reviewed which shows a nondisplaced metaphyseal fracture base of the fifth metatarsal.  Imaging: No results found. No images are attached to the encounter.  Labs: Lab Results  Component Value Date   ESRSEDRATE 2 07/28/2019     Lab Results  Component Value Date   ALBUMIN 4.5 05/02/2021    No results found for: "MG" Lab  Results  Component Value Date   VD25OH 40 10/18/2020    No results found for: "PREALBUMIN"    Latest Ref Rng & Units 05/02/2021    2:40 PM 10/18/2020    1:42 PM 07/28/2019    3:00 PM  CBC EXTENDED  WBC 4.0 - 10.5 K/uL 6.1  10.0  6.7   RBC 3.87 - 5.11 Mil/uL 4.66  5.15  4.67   Hemoglobin 12.0 - 15.0 g/dL 19.1  47.8  29.5   HCT 36.0 - 46.0 % 40.8  45.4  42.3   Platelets 150.0 - 400.0 K/uL 197.0  225  197   NEUT# 1.4 - 7.7 K/uL 3.8  7,390  4,516   Lymph# 0.7 - 4.0 K/uL 1.2  1,400  1,353      There is no height or weight on file to calculate BMI.  Orders:  No orders of the defined types were placed in this encounter.  No orders of the defined types were placed in this encounter.    Procedures: No procedures performed  Clinical Data: No additional findings.  ROS:  All other systems negative, except as noted in the HPI. Review of Systems  Objective: Vital Signs: There were no vitals taken for this visit.  Specialty Comments:  No specialty comments available.  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   Right thyroid nodule 10/18/2020   Iron deficiency anemia 10/18/2020   Recurrent epistaxis 08/07/2013   Past Medical History:  Diagnosis Date   Allergy    Ovarian cyst    8th grade    Family History  Problem Relation Age of Onset   Osteoarthritis Mother    Thyroid cancer Mother    Crohn's disease Sister    Lung cancer Maternal Grandmother    Kidney disease Maternal Grandfather    Heart attack Paternal Grandfather    Heart disease Paternal Grandfather     Past Surgical History:  Procedure Laterality Date   OTHER SURGICAL HISTORY     No surgical history    Social History   Occupational History   Not on file  Tobacco Use   Smoking status: Never   Smokeless tobacco: Never  Vaping Use   Vaping Use: Never used  Substance and Sexual Activity   Alcohol use: Yes    Alcohol/week: 2.0 standard drinks  of alcohol    Types: 1 Glasses of wine, 1 Cans of beer per week   Drug use: Never   Sexual activity: Yes    Birth control/protection: Condom, Diaphragm

## 2022-05-31 ENCOUNTER — Other Ambulatory Visit: Payer: Self-pay | Admitting: Physician Assistant

## 2022-05-31 ENCOUNTER — Telehealth: Payer: Self-pay | Admitting: Physician Assistant

## 2022-05-31 ENCOUNTER — Other Ambulatory Visit (HOSPITAL_COMMUNITY): Payer: Self-pay

## 2022-05-31 MED ORDER — SUMATRIPTAN SUCCINATE 100 MG PO TABS
100.0000 mg | ORAL_TABLET | ORAL | 1 refills | Status: DC | PRN
  Filled 2022-05-31: qty 9, 30d supply, fill #0
  Filled 2022-07-03: qty 9, 30d supply, fill #1
  Filled 2022-08-06: qty 9, 30d supply, fill #2

## 2022-05-31 NOTE — Telephone Encounter (Signed)
Patient requests new Rx for SUMAtriptan (IMITREX) 100 MG tablet  be sent to: [facility] Phone:  [phone]  Fax:  [phone]

## 2022-05-31 NOTE — Telephone Encounter (Signed)
Spoke to pt told her Rx for Imitrex was sent to pharmacy. Pt verbalized understanding.

## 2022-06-01 ENCOUNTER — Ambulatory Visit: Payer: Self-pay | Admitting: Podiatry

## 2022-06-14 ENCOUNTER — Other Ambulatory Visit: Payer: Self-pay | Admitting: Physician Assistant

## 2022-06-14 ENCOUNTER — Other Ambulatory Visit (HOSPITAL_COMMUNITY): Payer: Self-pay

## 2022-06-14 MED ORDER — FLUOXETINE HCL 20 MG PO CAPS
20.0000 mg | ORAL_CAPSULE | Freq: Every day | ORAL | 0 refills | Status: DC
  Filled 2022-06-14: qty 90, 90d supply, fill #0

## 2022-06-20 ENCOUNTER — Ambulatory Visit: Payer: No Typology Code available for payment source | Admitting: Family

## 2022-06-27 ENCOUNTER — Ambulatory Visit (INDEPENDENT_AMBULATORY_CARE_PROVIDER_SITE_OTHER): Payer: No Typology Code available for payment source

## 2022-06-27 ENCOUNTER — Ambulatory Visit (INDEPENDENT_AMBULATORY_CARE_PROVIDER_SITE_OTHER): Payer: No Typology Code available for payment source | Admitting: Orthopedic Surgery

## 2022-06-27 DIAGNOSIS — S92351A Displaced fracture of fifth metatarsal bone, right foot, initial encounter for closed fracture: Secondary | ICD-10-CM | POA: Diagnosis not present

## 2022-07-03 ENCOUNTER — Other Ambulatory Visit (HOSPITAL_COMMUNITY): Payer: Self-pay

## 2022-07-04 ENCOUNTER — Other Ambulatory Visit (HOSPITAL_COMMUNITY): Payer: Self-pay

## 2022-07-11 ENCOUNTER — Encounter: Payer: Self-pay | Admitting: Orthopedic Surgery

## 2022-07-11 NOTE — Progress Notes (Signed)
Office Visit Note   Patient: Sydney Blankenship           Date of Birth: 03-27-1996           MRN: [id number] Visit Date: 06/27/2022              Requested by: Jarold Motto, [place] [address] [place],  [place] [address] PCP: Jarold Motto, [place]  Chief Complaint  Patient presents with   Right Foot - Fracture, Follow-up      HPI: Patient is a 26 year old woman who is seen in follow-up for metaphyseal fracture base of the fifth metatarsal right foot.  Assessment & Plan: Visit Diagnoses:  1. Closed fracture of base of fifth metatarsal bone of right foot, initial encounter     Plan: Patient will advance her activities as tolerated in a stiff soled shoe.  Follow-Up Instructions: Return if symptoms worsen or fail to improve.   Ortho Exam  Patient is alert, oriented, no adenopathy, well-dressed, normal affect, normal respiratory effort. Examination the fracture is minimally tender to palpation patient has good range of motion the ankle and subtalar joint.  Imaging: No results found. No images are attached to the encounter.  Labs: Lab Results  Component Value Date   ESRSEDRATE 2 07/28/2019     Lab Results  Component Value Date   ALBUMIN 4.5 05/02/2021    No results found for: "MG" Lab Results  Component Value Date   VD25OH 40 10/18/2020    No results found for: "PREALBUMIN"    Latest Ref Rng & Units 05/02/2021    2:40 PM 10/18/2020    1:42 PM 07/28/2019    3:00 PM  CBC EXTENDED  WBC 4.0 - 10.5 K/uL 6.1  10.0  6.7   RBC 3.87 - 5.11 Mil/uL 4.66  5.15  4.67   Hemoglobin 12.0 - 15.0 g/dL 32.6  71.2  45.8   HCT 36.0 - 46.0 % 40.8  45.4  42.3   Platelets 150.0 - 400.0 K/uL 197.0  225  197   NEUT# 1.4 - 7.7 K/uL 3.8  7,390  4,516   Lymph# 0.7 - 4.0 K/uL 1.2  1,400  1,353      There is no height or weight on file to calculate BMI.  Orders:  Orders Placed This Encounter  Procedures   XR Foot Complete Right   No orders of the defined types were  placed in this encounter.    Procedures: No procedures performed  Clinical Data: No additional findings.  ROS:  All other systems negative, except as noted in the HPI. Review of Systems  Objective: Vital Signs: There were no vitals taken for this visit.  Specialty Comments:  No specialty comments available.  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   Right thyroid nodule 10/18/2020   Iron deficiency anemia 10/18/2020   Recurrent epistaxis 08/07/2013   Past Medical History:  Diagnosis Date   Allergy    Ovarian cyst    8th grade    Family History  Problem Relation Age of Onset   Osteoarthritis Mother    Thyroid cancer Mother    Crohn's disease Sister    Lung cancer Maternal Grandmother    Kidney disease Maternal Grandfather    Heart attack Paternal Grandfather    Heart disease Paternal Grandfather     Past Surgical History:  Procedure Laterality Date   OTHER SURGICAL HISTORY     No surgical history  Social History   Occupational History   Not on file  Tobacco Use   Smoking status: Never   Smokeless tobacco: Never  Vaping Use   Vaping Use: Never used  Substance and Sexual Activity   Alcohol use: Yes    Alcohol/week: 2.0 standard drinks of alcohol    Types: 1 Glasses of wine, 1 Cans of beer per week   Drug use: Never   Sexual activity: Yes    Birth control/protection: Condom, Diaphragm

## 2022-08-06 ENCOUNTER — Other Ambulatory Visit: Payer: Self-pay | Admitting: Physician Assistant

## 2022-08-07 ENCOUNTER — Other Ambulatory Visit: Payer: Self-pay | Admitting: Physician Assistant

## 2022-08-07 ENCOUNTER — Other Ambulatory Visit (HOSPITAL_COMMUNITY): Payer: Self-pay

## 2022-08-07 MED ORDER — MINOCYCLINE HCL 100 MG PO CAPS
100.0000 mg | ORAL_CAPSULE | Freq: Two times a day (BID) | ORAL | 0 refills | Status: DC
  Filled 2022-08-07 – 2022-08-15 (×2): qty 60, 30d supply, fill #0
  Filled 2022-09-09: qty 60, 30d supply, fill #1
  Filled 2022-11-01: qty 60, 30d supply, fill #2

## 2022-08-07 MED ORDER — SUMATRIPTAN SUCCINATE 100 MG PO TABS
100.0000 mg | ORAL_TABLET | ORAL | 1 refills | Status: DC | PRN
  Filled 2022-08-07 – 2022-08-11 (×2): qty 10, 30d supply, fill #0
  Filled 2022-09-09 – 2022-09-11 (×2): qty 10, 30d supply, fill #1

## 2022-08-11 ENCOUNTER — Other Ambulatory Visit (HOSPITAL_COMMUNITY): Payer: Self-pay

## 2022-08-15 ENCOUNTER — Other Ambulatory Visit (HOSPITAL_COMMUNITY): Payer: Self-pay

## 2022-08-29 ENCOUNTER — Other Ambulatory Visit (HOSPITAL_COMMUNITY): Payer: Self-pay

## 2022-09-04 ENCOUNTER — Encounter: Payer: Self-pay | Admitting: *Deleted

## 2022-09-09 ENCOUNTER — Other Ambulatory Visit: Payer: Self-pay | Admitting: Physician Assistant

## 2022-09-11 ENCOUNTER — Other Ambulatory Visit (HOSPITAL_COMMUNITY): Payer: Self-pay

## 2022-09-11 ENCOUNTER — Encounter: Payer: Self-pay | Admitting: Neurology

## 2022-09-11 MED ORDER — FLUOXETINE HCL 20 MG PO CAPS
20.0000 mg | ORAL_CAPSULE | Freq: Every day | ORAL | 0 refills | Status: DC
  Filled 2022-09-11: qty 90, 90d supply, fill #0

## 2022-09-12 ENCOUNTER — Other Ambulatory Visit (HOSPITAL_COMMUNITY): Payer: Self-pay

## 2022-09-13 ENCOUNTER — Other Ambulatory Visit (HOSPITAL_COMMUNITY): Payer: Self-pay

## 2022-09-13 ENCOUNTER — Ambulatory Visit: Payer: Self-pay | Admitting: Neurology

## 2022-09-14 ENCOUNTER — Other Ambulatory Visit (HOSPITAL_COMMUNITY): Payer: Self-pay

## 2022-09-15 ENCOUNTER — Other Ambulatory Visit (HOSPITAL_COMMUNITY): Payer: Self-pay

## 2022-10-06 ENCOUNTER — Other Ambulatory Visit: Payer: Self-pay | Admitting: Physician Assistant

## 2022-10-06 ENCOUNTER — Other Ambulatory Visit (HOSPITAL_COMMUNITY): Payer: Self-pay

## 2022-10-06 MED ORDER — SUMATRIPTAN SUCCINATE 100 MG PO TABS
100.0000 mg | ORAL_TABLET | ORAL | 1 refills | Status: DC | PRN
  Filled 2022-10-06 (×2): qty 10, 30d supply, fill #0
  Filled 2022-11-01: qty 10, 30d supply, fill #1

## 2022-11-01 ENCOUNTER — Other Ambulatory Visit (HOSPITAL_COMMUNITY): Payer: Self-pay

## 2022-11-09 ENCOUNTER — Other Ambulatory Visit (HOSPITAL_COMMUNITY): Payer: Self-pay

## 2022-11-23 ENCOUNTER — Encounter: Payer: Self-pay | Admitting: *Deleted

## 2022-11-30 ENCOUNTER — Other Ambulatory Visit: Payer: Self-pay | Admitting: Physician Assistant

## 2022-11-30 ENCOUNTER — Other Ambulatory Visit (HOSPITAL_COMMUNITY): Payer: Self-pay

## 2022-11-30 MED ORDER — SUMATRIPTAN SUCCINATE 100 MG PO TABS
100.0000 mg | ORAL_TABLET | ORAL | 1 refills | Status: DC | PRN
  Filled 2022-11-30: qty 10, 30d supply, fill #0
  Filled 2022-12-01: qty 10, 1d supply, fill #0
  Filled 2022-12-27: qty 10, 1d supply, fill #1
  Filled 2022-12-28: qty 10, 30d supply, fill #1

## 2022-11-30 MED ORDER — MINOCYCLINE HCL 100 MG PO CAPS
100.0000 mg | ORAL_CAPSULE | Freq: Two times a day (BID) | ORAL | 0 refills | Status: AC
  Filled 2022-11-30: qty 60, 30d supply, fill #0
  Filled 2022-12-27: qty 60, 30d supply, fill #1
  Filled 2023-02-17: qty 60, 30d supply, fill #2

## 2022-12-01 ENCOUNTER — Other Ambulatory Visit (HOSPITAL_COMMUNITY): Payer: Self-pay

## 2022-12-06 ENCOUNTER — Other Ambulatory Visit (HOSPITAL_COMMUNITY): Payer: Self-pay

## 2022-12-06 ENCOUNTER — Telehealth: Payer: Self-pay | Admitting: Physician Assistant

## 2022-12-06 DIAGNOSIS — B349 Viral infection, unspecified: Secondary | ICD-10-CM

## 2022-12-06 DIAGNOSIS — R112 Nausea with vomiting, unspecified: Secondary | ICD-10-CM

## 2022-12-06 MED ORDER — ONDANSETRON 4 MG PO TBDP
4.0000 mg | ORAL_TABLET | Freq: Three times a day (TID) | ORAL | 0 refills | Status: AC | PRN
  Filled 2022-12-06 – 2023-05-20 (×2): qty 20, 7d supply, fill #0

## 2022-12-06 NOTE — Progress Notes (Signed)
I have spent 5 minutes in review of e-visit questionnaire, review and updating patient chart, medical decision making and response to patient.   Cassiopeia Florentino Cody Abimael Zeiter, PA-C    

## 2022-12-06 NOTE — Progress Notes (Signed)

## 2022-12-24 ENCOUNTER — Other Ambulatory Visit (HOSPITAL_COMMUNITY): Payer: Self-pay

## 2022-12-27 ENCOUNTER — Other Ambulatory Visit (HOSPITAL_COMMUNITY): Payer: Self-pay

## 2022-12-27 ENCOUNTER — Other Ambulatory Visit: Payer: Self-pay

## 2022-12-27 ENCOUNTER — Other Ambulatory Visit: Payer: Self-pay | Admitting: Physician Assistant

## 2022-12-27 MED ORDER — FLUOXETINE HCL 20 MG PO CAPS
20.0000 mg | ORAL_CAPSULE | Freq: Every day | ORAL | 0 refills | Status: AC
  Filled 2022-12-27: qty 30, 30d supply, fill #0
  Filled 2023-01-26: qty 30, 30d supply, fill #1
  Filled 2023-02-17: qty 30, 30d supply, fill #2

## 2022-12-28 ENCOUNTER — Other Ambulatory Visit (HOSPITAL_COMMUNITY): Payer: Self-pay

## 2022-12-29 ENCOUNTER — Other Ambulatory Visit (HOSPITAL_COMMUNITY): Payer: Self-pay

## 2023-01-18 ENCOUNTER — Telehealth: Payer: Self-pay | Admitting: Physician Assistant

## 2023-01-18 NOTE — Telephone Encounter (Signed)
Patient requests a copy of her immunization record.  Requests to be called at ph# [phone]  when ready for Patient to pick up at office.

## 2023-01-19 NOTE — Telephone Encounter (Signed)
Spoke to pt told her I have copy of immunization record ready for her to pickup will put at the front desk. Pt verbalized understanding.

## 2023-01-26 ENCOUNTER — Other Ambulatory Visit (HOSPITAL_COMMUNITY): Payer: Self-pay

## 2023-01-26 ENCOUNTER — Other Ambulatory Visit: Payer: Self-pay | Admitting: Physician Assistant

## 2023-01-26 ENCOUNTER — Other Ambulatory Visit: Payer: Self-pay

## 2023-01-26 MED ORDER — SUMATRIPTAN SUCCINATE 100 MG PO TABS
100.0000 mg | ORAL_TABLET | ORAL | 1 refills | Status: DC | PRN
  Filled 2023-01-26 (×2): qty 10, 30d supply, fill #0
  Filled 2023-02-17 – 2023-02-21 (×2): qty 10, 30d supply, fill #1

## 2023-01-30 ENCOUNTER — Ambulatory Visit: Payer: Self-pay | Admitting: Neurology

## 2023-02-19 ENCOUNTER — Other Ambulatory Visit (HOSPITAL_COMMUNITY): Payer: Self-pay

## 2023-02-19 ENCOUNTER — Other Ambulatory Visit: Payer: Self-pay

## 2023-02-21 ENCOUNTER — Other Ambulatory Visit (HOSPITAL_COMMUNITY): Payer: Self-pay

## 2023-03-19 ENCOUNTER — Other Ambulatory Visit: Payer: Self-pay | Admitting: Physician Assistant

## 2023-03-19 ENCOUNTER — Other Ambulatory Visit (HOSPITAL_COMMUNITY): Payer: Self-pay

## 2023-03-19 MED ORDER — SUMATRIPTAN SUCCINATE 100 MG PO TABS
100.0000 mg | ORAL_TABLET | ORAL | 0 refills | Status: AC | PRN
  Filled 2023-03-19: qty 10, 30d supply, fill #0

## 2023-03-23 ENCOUNTER — Other Ambulatory Visit (HOSPITAL_COMMUNITY): Payer: Self-pay

## 2023-04-02 ENCOUNTER — Other Ambulatory Visit (HOSPITAL_COMMUNITY): Payer: Self-pay

## 2023-05-20 ENCOUNTER — Other Ambulatory Visit: Payer: Self-pay | Admitting: Physician Assistant

## 2023-05-21 ENCOUNTER — Other Ambulatory Visit (HOSPITAL_COMMUNITY): Payer: Self-pay

## 2023-05-21 ENCOUNTER — Other Ambulatory Visit: Payer: Self-pay

## 2023-05-22 ENCOUNTER — Other Ambulatory Visit (HOSPITAL_COMMUNITY): Payer: Self-pay

## 2023-05-25 ENCOUNTER — Other Ambulatory Visit: Payer: Self-pay | Admitting: Physician Assistant

## 2023-05-28 ENCOUNTER — Other Ambulatory Visit (HOSPITAL_COMMUNITY): Payer: Self-pay

## 2023-06-05 ENCOUNTER — Encounter: Payer: 59 | Admitting: Physician Assistant

## 2024-02-25 ENCOUNTER — Other Ambulatory Visit (HOSPITAL_COMMUNITY): Payer: Self-pay
